# Patient Record
Sex: Female | Born: 1985 | Race: White | Hispanic: No | Marital: Married | Smoking: Never smoker
Health system: Southern US, Community
[De-identification: ages and names within clinical notes are randomized; demographics above are authoritative.]

## PROBLEM LIST (undated history)

## (undated) DIAGNOSIS — E669 Obesity, unspecified: Secondary | ICD-10-CM

## (undated) DIAGNOSIS — I499 Cardiac arrhythmia, unspecified: Secondary | ICD-10-CM

## (undated) DIAGNOSIS — E039 Hypothyroidism, unspecified: Secondary | ICD-10-CM

## (undated) DIAGNOSIS — T8859XA Other complications of anesthesia, initial encounter: Secondary | ICD-10-CM

## (undated) DIAGNOSIS — I10 Essential (primary) hypertension: Secondary | ICD-10-CM

## (undated) HISTORY — DX: Essential (primary) hypertension: I10

## (undated) HISTORY — PX: TONSILLECTOMY: SHX5217

## (undated) HISTORY — DX: Hypothyroidism, unspecified: E03.9

## (undated) HISTORY — DX: Obesity, unspecified: E66.9

---

## 2005-06-10 ENCOUNTER — Emergency Department (HOSPITAL_COMMUNITY): Admission: EM | Admit: 2005-06-10 | Discharge: 2005-06-10 | Payer: Self-pay | Admitting: Emergency Medicine

## 2006-02-09 ENCOUNTER — Emergency Department (HOSPITAL_COMMUNITY): Admission: EM | Admit: 2006-02-09 | Discharge: 2006-02-09 | Payer: Self-pay | Admitting: Emergency Medicine

## 2006-09-18 ENCOUNTER — Emergency Department (HOSPITAL_COMMUNITY): Admission: EM | Admit: 2006-09-18 | Discharge: 2006-09-18 | Payer: Self-pay | Admitting: Emergency Medicine

## 2006-09-20 ENCOUNTER — Emergency Department (HOSPITAL_COMMUNITY): Admission: EM | Admit: 2006-09-20 | Discharge: 2006-09-21 | Payer: Self-pay | Admitting: *Deleted

## 2007-08-13 ENCOUNTER — Ambulatory Visit: Payer: Self-pay | Admitting: Internal Medicine

## 2008-10-11 ENCOUNTER — Ambulatory Visit: Payer: Self-pay

## 2009-02-14 ENCOUNTER — Ambulatory Visit: Payer: Self-pay

## 2009-05-08 ENCOUNTER — Ambulatory Visit: Payer: Self-pay | Admitting: Otolaryngology

## 2009-05-10 ENCOUNTER — Ambulatory Visit: Payer: Self-pay | Admitting: Otolaryngology

## 2010-11-29 ENCOUNTER — Observation Stay: Payer: Self-pay | Admitting: Obstetrics and Gynecology

## 2010-12-05 ENCOUNTER — Observation Stay: Payer: Self-pay | Admitting: Obstetrics and Gynecology

## 2010-12-15 ENCOUNTER — Observation Stay: Payer: Self-pay

## 2010-12-24 ENCOUNTER — Observation Stay: Payer: Self-pay

## 2010-12-27 ENCOUNTER — Inpatient Hospital Stay: Payer: Self-pay | Admitting: Obstetrics and Gynecology

## 2010-12-28 DIAGNOSIS — Z950 Presence of cardiac pacemaker: Secondary | ICD-10-CM

## 2010-12-29 DIAGNOSIS — R011 Cardiac murmur, unspecified: Secondary | ICD-10-CM

## 2011-01-08 ENCOUNTER — Encounter: Payer: Self-pay | Admitting: Cardiovascular Disease

## 2011-08-14 ENCOUNTER — Ambulatory Visit: Payer: Self-pay | Admitting: Family Medicine

## 2012-10-30 ENCOUNTER — Emergency Department: Payer: Self-pay | Admitting: Emergency Medicine

## 2012-10-30 LAB — URINALYSIS, COMPLETE
Bilirubin,UR: NEGATIVE
Glucose,UR: NEGATIVE mg/dL (ref 0–75)
Ketone: NEGATIVE
Protein: 30
RBC,UR: 2 /HPF (ref 0–5)
Specific Gravity: 1.026 (ref 1.003–1.030)
Squamous Epithelial: 6
WBC UR: 2 /HPF (ref 0–5)

## 2012-10-30 LAB — CBC
HCT: 43.2 % (ref 35.0–47.0)
HGB: 14.7 g/dL (ref 12.0–16.0)
MCH: 27.9 pg (ref 26.0–34.0)
MCV: 82 fL (ref 80–100)
Platelet: 376 10*3/uL (ref 150–440)
RBC: 5.25 10*6/uL — ABNORMAL HIGH (ref 3.80–5.20)
WBC: 10.8 10*3/uL (ref 3.6–11.0)

## 2012-10-30 LAB — COMPREHENSIVE METABOLIC PANEL
Albumin: 3.9 g/dL (ref 3.4–5.0)
Alkaline Phosphatase: 99 U/L (ref 50–136)
Anion Gap: 9 (ref 7–16)
BUN: 16 mg/dL (ref 7–18)
Bilirubin,Total: 0.7 mg/dL (ref 0.2–1.0)
Chloride: 106 mmol/L (ref 98–107)
Co2: 25 mmol/L (ref 21–32)
Creatinine: 0.64 mg/dL (ref 0.60–1.30)
EGFR (African American): >60
EGFR (Non-African Amer.): >60
Osmolality: 283 (ref 275–301)
SGOT(AST): 21 U/L (ref 15–37)
SGPT (ALT): 38 U/L (ref 12–78)
Sodium: 140 mmol/L (ref 136–145)
Total Protein: 8 g/dL (ref 6.4–8.2)

## 2012-10-30 LAB — LIPASE, BLOOD: Lipase: 124 U/L (ref 73–393)

## 2012-11-06 ENCOUNTER — Ambulatory Visit: Payer: Self-pay | Admitting: Surgery

## 2012-11-09 ENCOUNTER — Ambulatory Visit: Payer: Self-pay | Admitting: Surgery

## 2013-12-08 LAB — HM PAP SMEAR

## 2013-12-25 IMAGING — US ABDOMEN ULTRASOUND LIMITED
1 series · 14 of 25 positions shown · non-contrast
Comparison: none

REASON FOR EXAM: RUQ pain with hx gallstones
COMMENTS:   Body Site: GB and Fossa, CBD, Head of Pancreas

PROCEDURE:     US  - US ABDOMEN LIMITED SURVEY  - October 30, 2012  [DATE]
RESULT:     Comparison: 08/14/2011
TECHNIQUE: Multiple grayscale and color Doppler images were obtained of the
right upper quadrant.

[Series 1: abdomen ultrasound limited · 0.35mm/px · 14 of 27 slices shown]
[im 1/27]
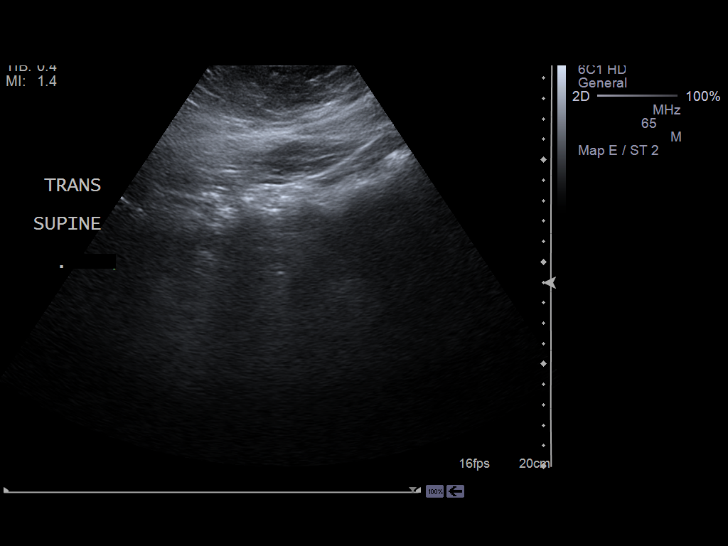
[im 3/27]
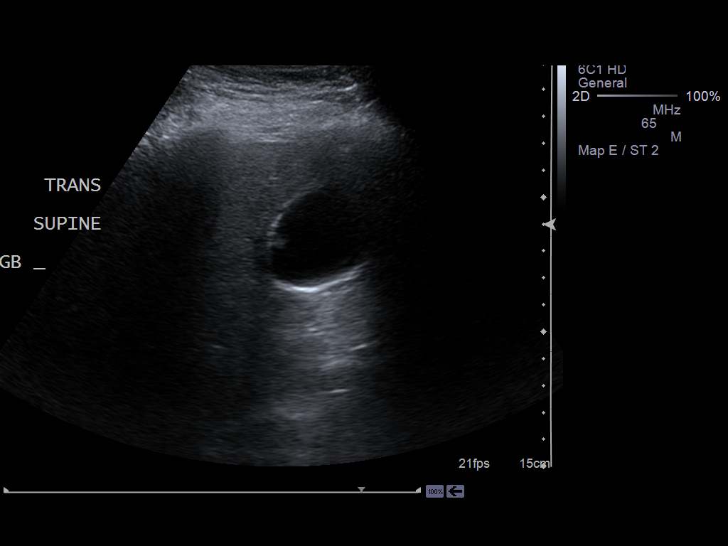
[im 5/27]
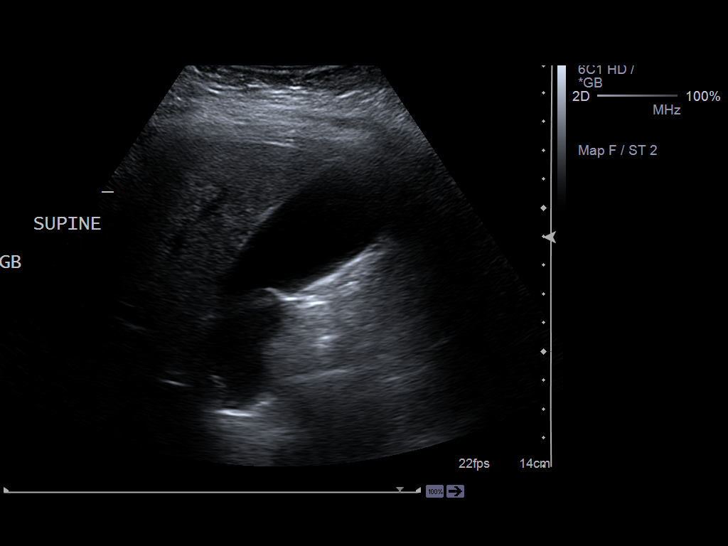
[im 7/27]
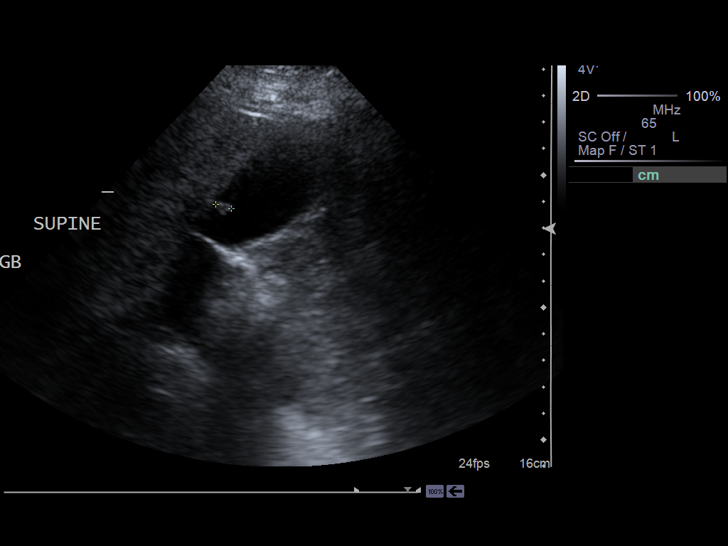
[im 9/27]
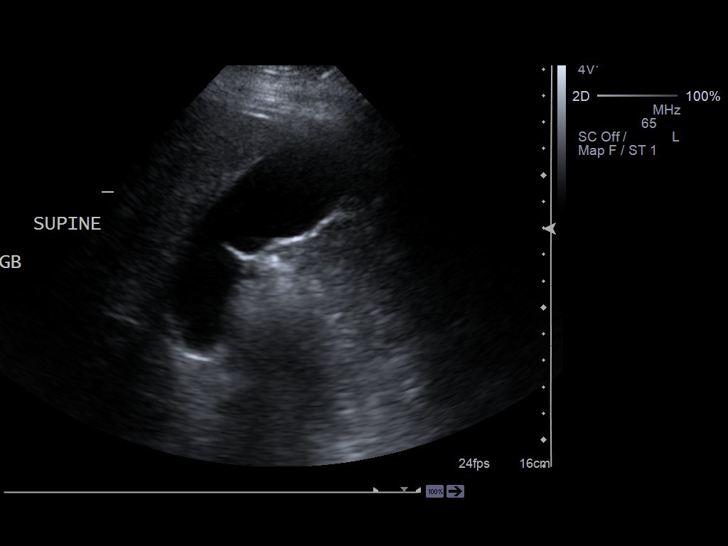
[im 10/27]
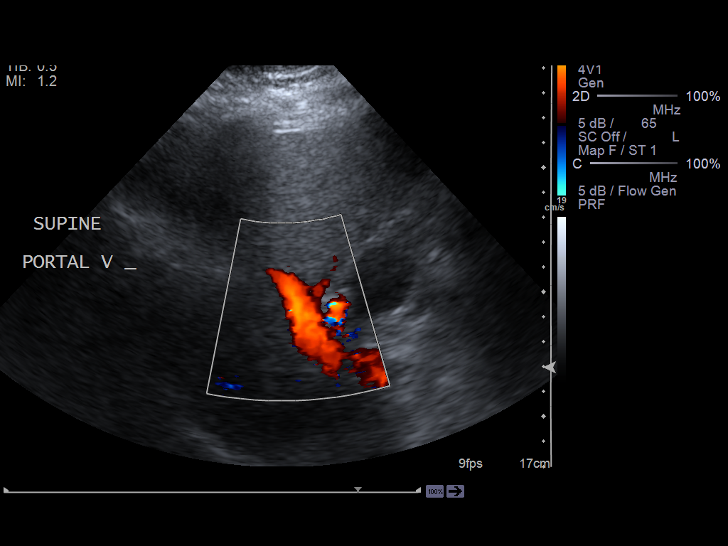
[im 12/27]
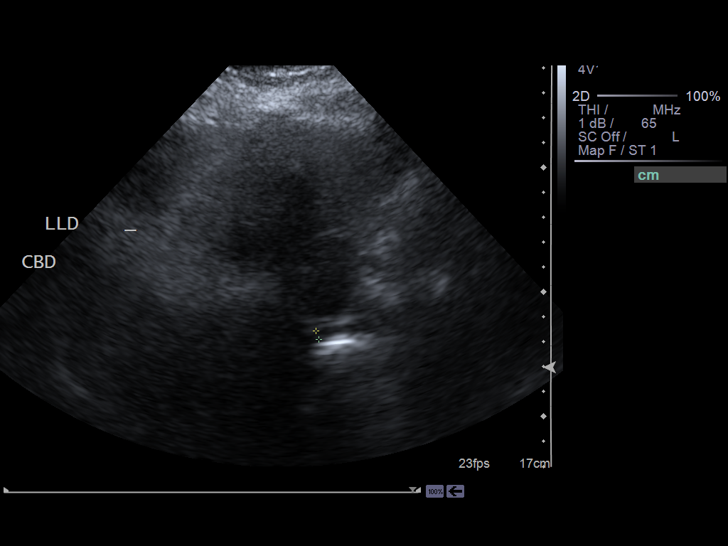
[im 15/27]
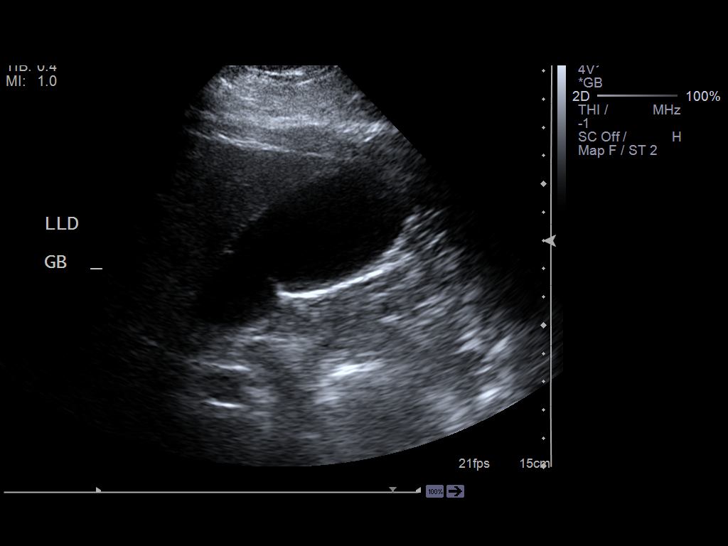
[im 17/27]
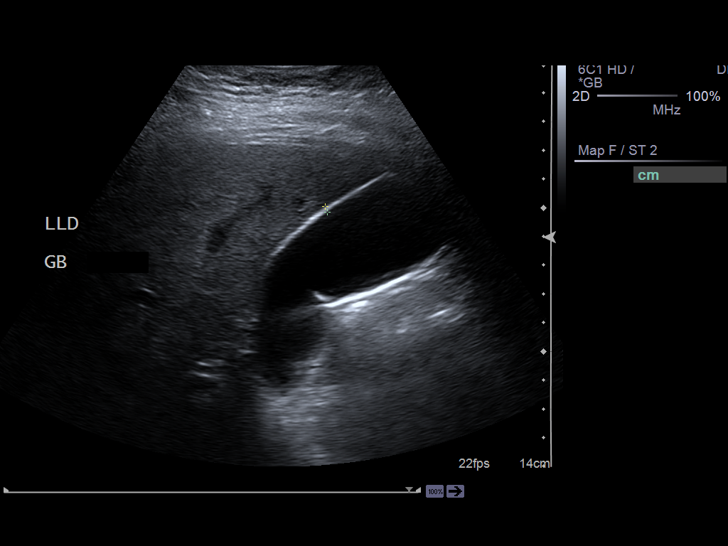
[im 18/27]
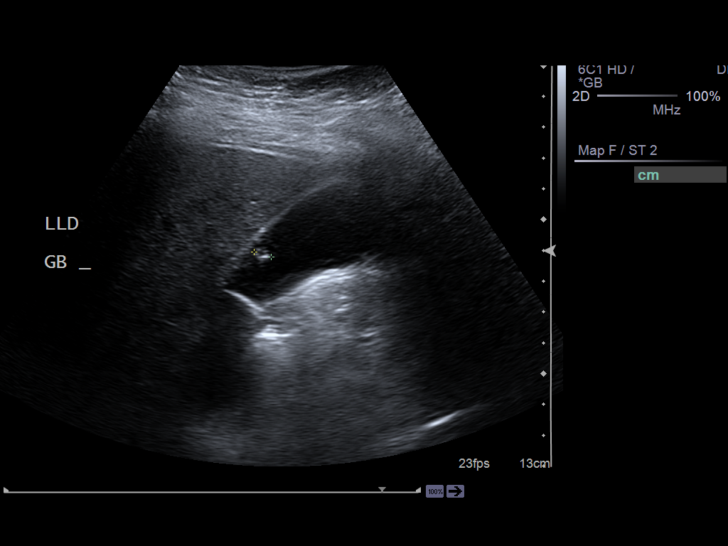
[im 20/27]
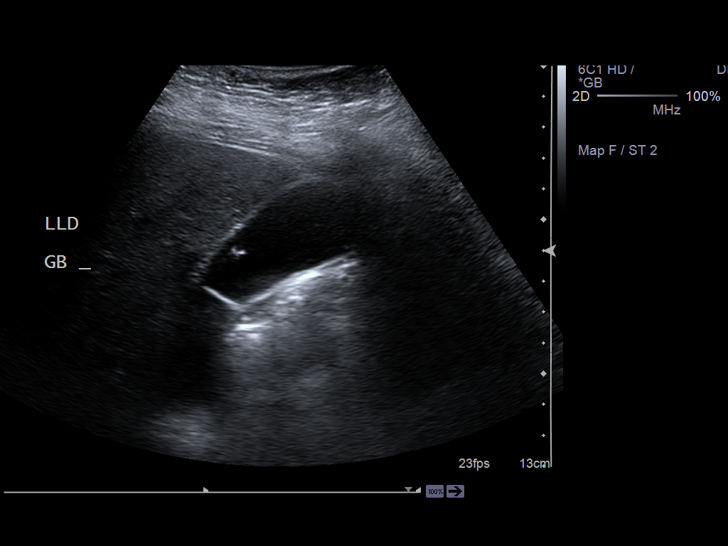
[im 22/27]
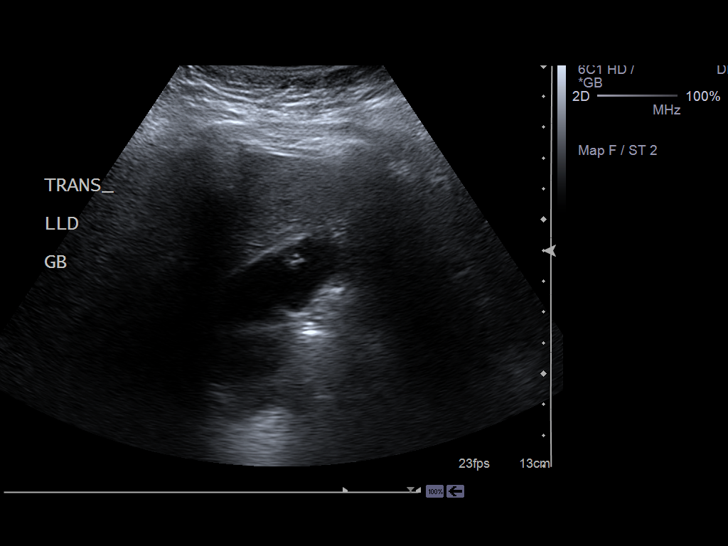
[im 24/27]
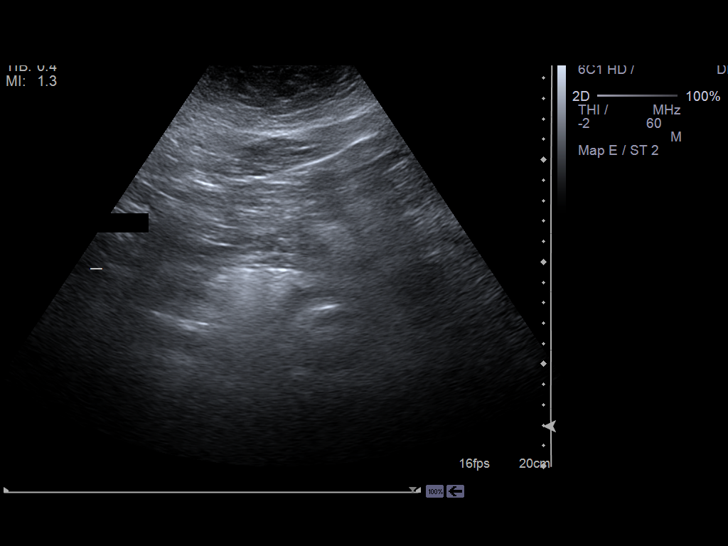
[im 27/27]
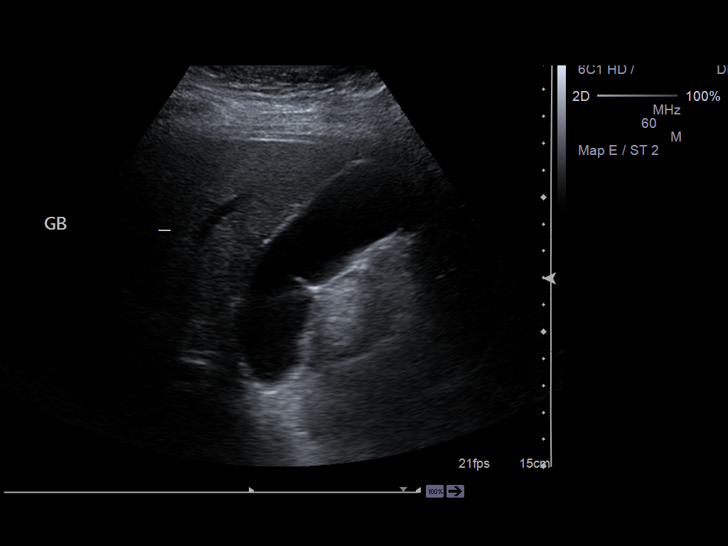

[14 of 25 positions shown; findings below may reference images not displayed]

FINDINGS: The pancreas was obscured overlying bowel gas. There is a 6 mm round
hypoechoic focus along the nondependent wall of the gallbladder. By report
from the technologist, this was nonmobile. This likely represents a polyp,
and is similar to prior. Otherwise, no gallstones seen. No pericholecystic
fluid or gallbladder wall thickening. The common bile duct measures 4 mm in
diameter.

The main portal vein is patent. The visualized portion of the liver is
unremarkable.
IMPRESSION: 1. Findings which likely represent a 6 mm polyp in the gallbladder, similar
to prior. Given its size, continued followup is suggested to ensure
stability.
2. Otherwise, no cholelithiasis.

## 2014-01-04 IMAGING — NM NUCLEAR MEDICINE HEPATOHBILIARY INCLUDE GB
2 series · 12 of 12 positions shown · non-contrast
Comparison: none

REASON FOR EXAM: neg US  US showed polyps
COMMENTS:
TECHNIQUE: Following the uneventful intravenous infusion of
radiopharmaceutical, dynamic anterior regional imaging was obtained over the
liver for 60 minutes.

[Series 1000: gallbladder dynamic (results) · 4.80mm/px · 6 of 60 frames shown]
[frame 6/60]
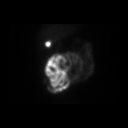
[frame 16/60]
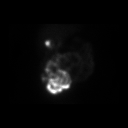
[frame 26/60]
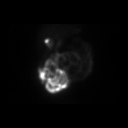
[frame 36/60]
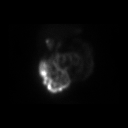
[frame 46/60]
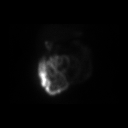
[frame 56/60]
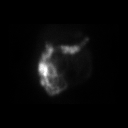

[Series 1000: gallbladder dynamic · 4.80mm/px · 6 of 60 frames shown]
[frame 6/60]
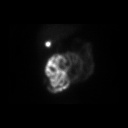
[frame 16/60]
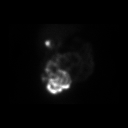
[frame 26/60]
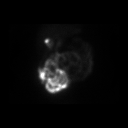
[frame 36/60]
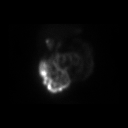
[frame 46/60]
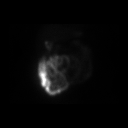
[frame 56/60]
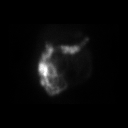

[12 of 12 positions shown; findings below may reference images not displayed]

PROCEDURE:     KNM - KNM HEPATO W/GB EJECT FRACTION  - November 09, 2012  [DATE]

RESULT:     Comparison: None.

Radiopharmaceutical: 8.46 mCi 6c-MMm labeled Choletec was administered
intravenously. Once the gallbladder had accumulated tracer, the patient was
given CCK intravenously per standard protocol.
FINDINGS: There is immediate homogeneous uptake of radiotracer in the liver.
Filling of the gallbladder was seen after 40 minutes. Radiotracer uptake is
present in the small bowel at 10 minutes.

When gallbladder filling was complete, the patient was given an infusion of
2.36 mcg CCK over 30 minutes. At 30 minutes, the total ejection fraction was
85%, which is within normal limits. The normal range of ejection fraction is
greater than 35%.
IMPRESSION: 1. Patent cystic duct and common bile duct.
2. Normal gallbladder ejection fraction.

[REDACTED]

## 2014-08-13 ENCOUNTER — Emergency Department: Payer: Self-pay | Admitting: Student

## 2014-10-20 ENCOUNTER — Ambulatory Visit: Payer: Self-pay | Admitting: Internal Medicine

## 2015-03-16 ENCOUNTER — Other Ambulatory Visit: Payer: Self-pay | Admitting: Internal Medicine

## 2015-03-16 DIAGNOSIS — M5442 Lumbago with sciatica, left side: Secondary | ICD-10-CM

## 2015-03-24 ENCOUNTER — Ambulatory Visit
Admission: RE | Admit: 2015-03-24 | Discharge: 2015-03-24 | Disposition: A | Discharge status: Home / Self Care | Payer: BC Managed Care – PPO | Source: Ambulatory Visit | Attending: Internal Medicine | Admitting: Internal Medicine

## 2015-03-24 DIAGNOSIS — M5442 Lumbago with sciatica, left side: Secondary | ICD-10-CM

## 2015-03-24 DIAGNOSIS — M5127 Other intervertebral disc displacement, lumbosacral region: Secondary | ICD-10-CM | POA: Diagnosis not present

## 2015-10-08 IMAGING — CR DG FOOT COMPLETE 3+V*L*
1 series · 3 of 3 positions shown · non-contrast
Comparison: None.

CLINICAL DATA: Left foot pain after injury.

EXAM:
LEFT FOOT - COMPLETE 3+ VIEW

[Series 1: dxr foot lt comp w/obliques · 0.14mm/px · 3 of 3 slices shown]
[im 1/3]
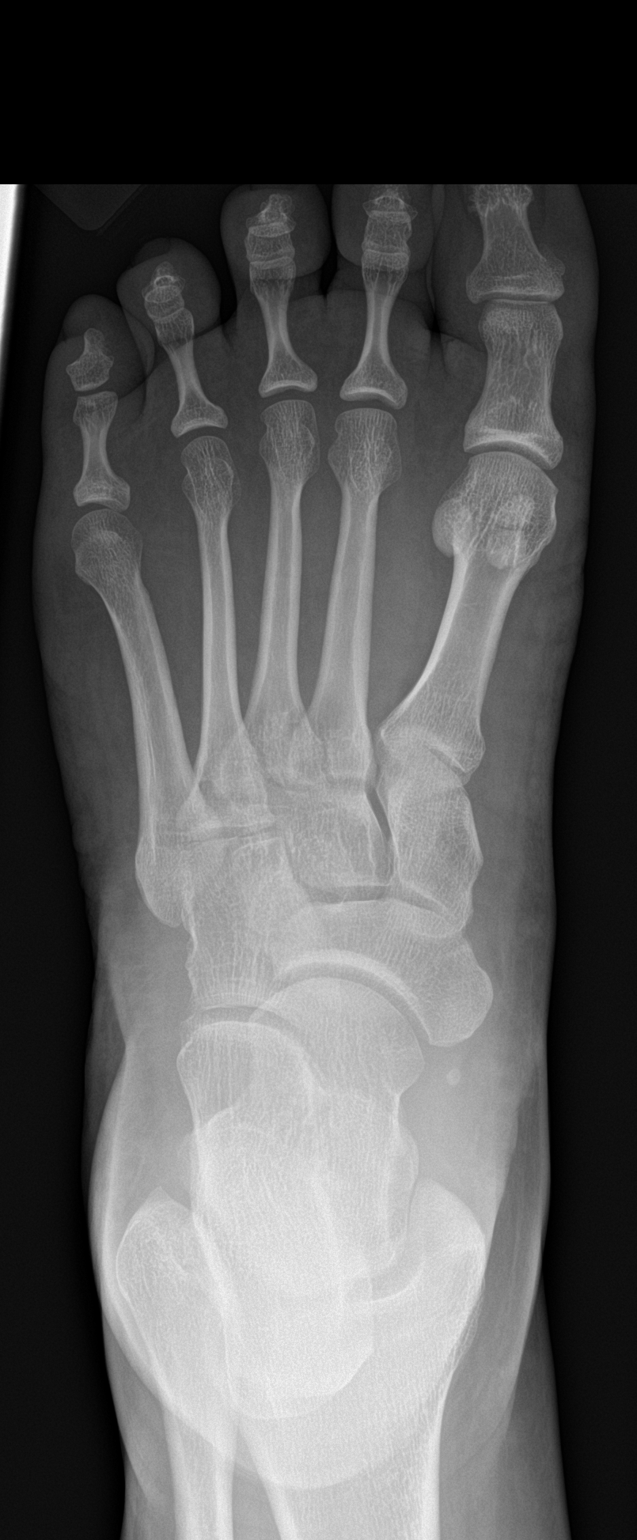
[im 2/3]
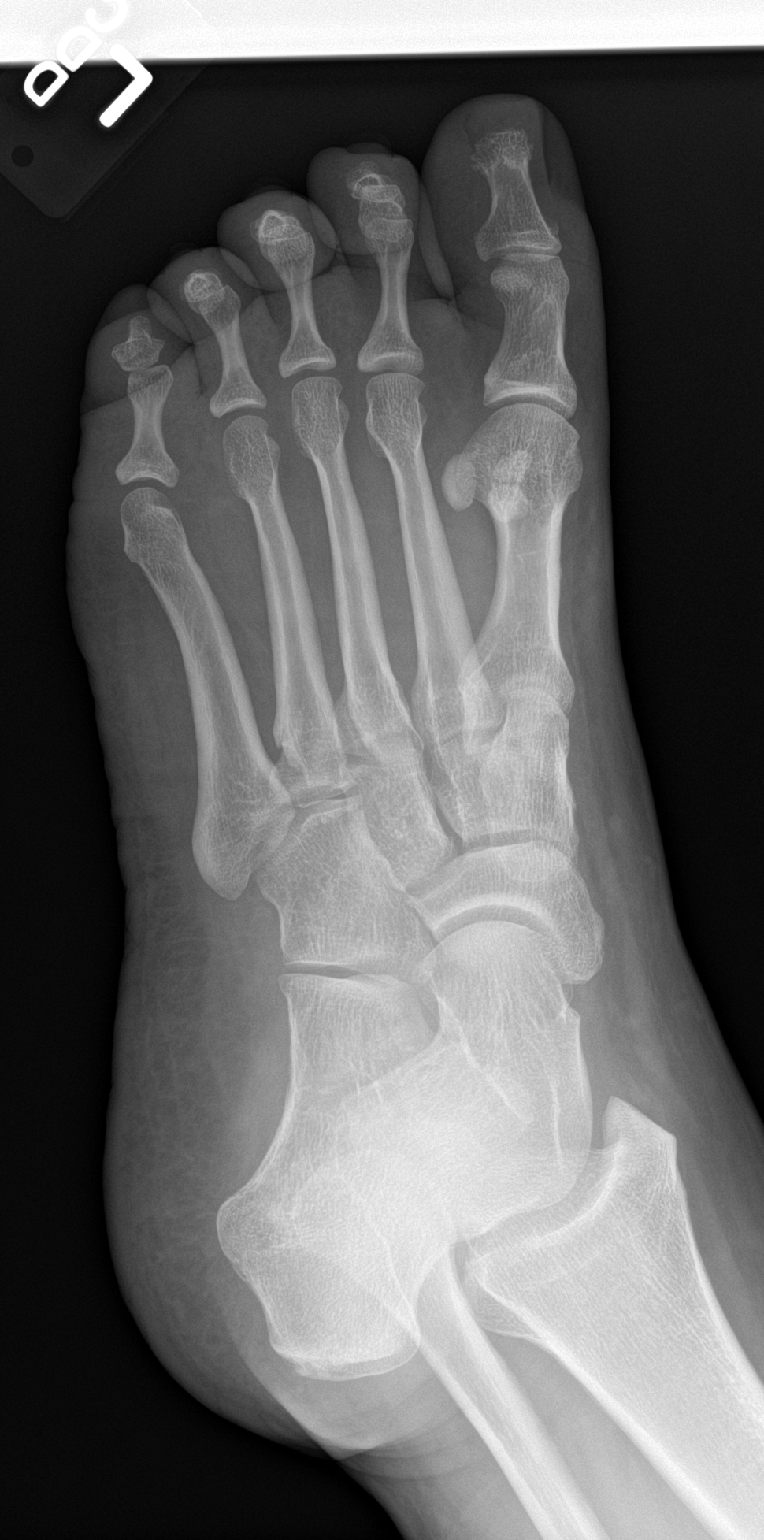
[im 3/3]
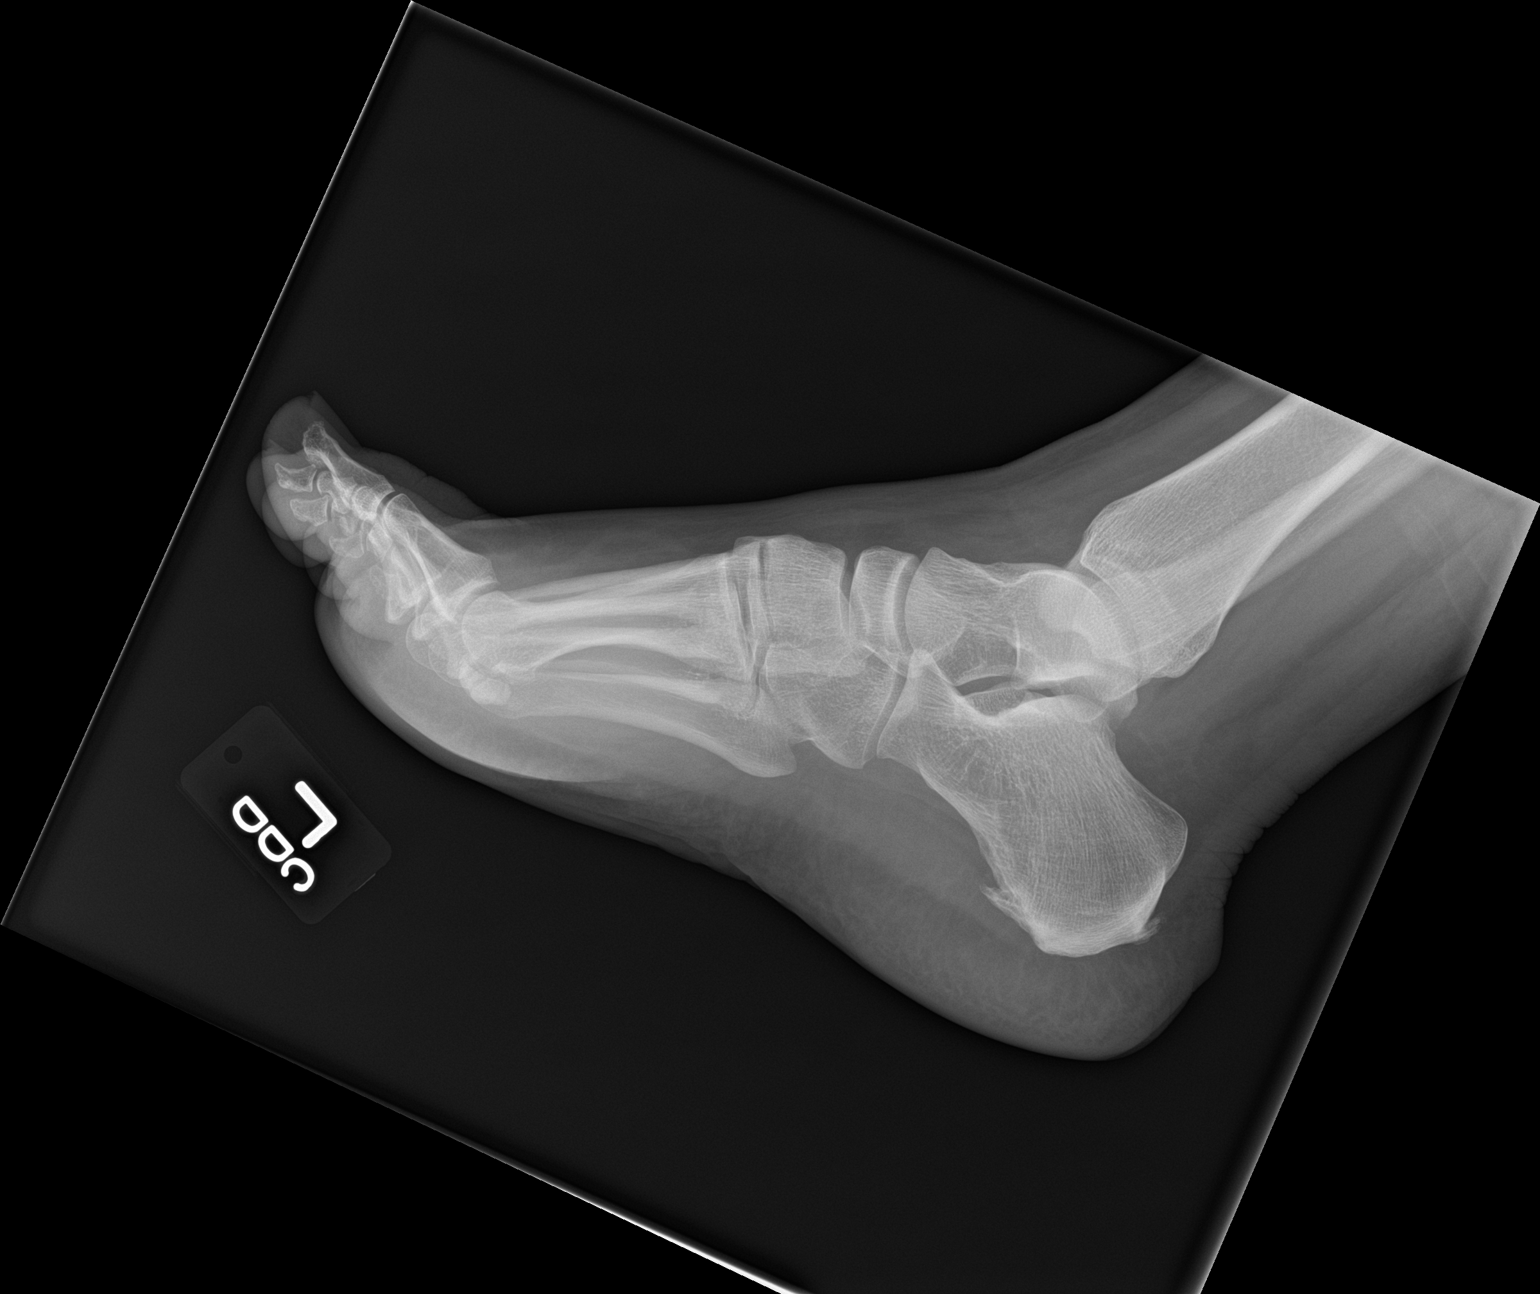

[3 of 3 positions shown; findings below may reference images not displayed]

FINDINGS: There is no evidence of fracture or dislocation. Spurring of
posterior calcaneus is noted. Joint spaces appear intact. Soft
tissues are unremarkable.
IMPRESSION: No significant abnormality seen in the left foot.

## 2016-05-14 ENCOUNTER — Other Ambulatory Visit: Payer: Self-pay | Admitting: Internal Medicine

## 2016-05-14 ENCOUNTER — Other Ambulatory Visit: Payer: Self-pay | Admitting: Physician Assistant

## 2016-05-14 DIAGNOSIS — N6310 Unspecified lump in the right breast, unspecified quadrant: Secondary | ICD-10-CM

## 2016-05-14 DIAGNOSIS — N6315 Unspecified lump in the right breast, overlapping quadrants: Secondary | ICD-10-CM

## 2016-05-14 DIAGNOSIS — N644 Mastodynia: Secondary | ICD-10-CM

## 2016-05-30 ENCOUNTER — Other Ambulatory Visit: Payer: Self-pay

## 2016-05-30 ENCOUNTER — Ambulatory Visit: Payer: BC Managed Care – PPO

## 2017-11-18 DIAGNOSIS — D279 Benign neoplasm of unspecified ovary: Secondary | ICD-10-CM

## 2017-11-18 HISTORY — DX: Benign neoplasm of unspecified ovary: D27.9

## 2018-08-11 ENCOUNTER — Encounter: Payer: Self-pay | Admitting: Obstetrics and Gynecology

## 2018-08-12 ENCOUNTER — Ambulatory Visit (INDEPENDENT_AMBULATORY_CARE_PROVIDER_SITE_OTHER): Payer: BC Managed Care – PPO | Admitting: Obstetrics and Gynecology

## 2018-08-12 ENCOUNTER — Other Ambulatory Visit (HOSPITAL_COMMUNITY)
Admission: RE | Admit: 2018-08-12 | Discharge: 2018-08-12 | Disposition: A | Discharge status: Home / Self Care | Payer: BC Managed Care – PPO | Source: Ambulatory Visit | Attending: Obstetrics and Gynecology | Admitting: Obstetrics and Gynecology

## 2018-08-12 ENCOUNTER — Encounter: Payer: Self-pay | Admitting: Obstetrics and Gynecology

## 2018-08-12 VITALS — BP 122/80 | HR 81 | Ht 67.0 in | Wt 236.0 lb

## 2018-08-12 DIAGNOSIS — Z124 Encounter for screening for malignant neoplasm of cervix: Secondary | ICD-10-CM

## 2018-08-12 DIAGNOSIS — Z01411 Encounter for gynecological examination (general) (routine) with abnormal findings: Secondary | ICD-10-CM | POA: Diagnosis not present

## 2018-08-12 DIAGNOSIS — N6312 Unspecified lump in the right breast, upper inner quadrant: Secondary | ICD-10-CM

## 2018-08-12 DIAGNOSIS — Z3041 Encounter for surveillance of contraceptive pills: Secondary | ICD-10-CM

## 2018-08-12 DIAGNOSIS — Z1151 Encounter for screening for human papillomavirus (HPV): Secondary | ICD-10-CM | POA: Diagnosis not present

## 2018-08-12 DIAGNOSIS — Z1239 Encounter for other screening for malignant neoplasm of breast: Secondary | ICD-10-CM

## 2018-08-12 DIAGNOSIS — Z1231 Encounter for screening mammogram for malignant neoplasm of breast: Secondary | ICD-10-CM

## 2018-08-12 DIAGNOSIS — Z01419 Encounter for gynecological examination (general) (routine) without abnormal findings: Secondary | ICD-10-CM

## 2018-08-12 DIAGNOSIS — R1032 Left lower quadrant pain: Secondary | ICD-10-CM

## 2018-08-12 DIAGNOSIS — N6310 Unspecified lump in the right breast, unspecified quadrant: Secondary | ICD-10-CM

## 2018-08-12 NOTE — Progress Notes (Addendum)
PCP:  Patient, No Pcp Per   Chief Complaint  Patient presents with  . Gynecologic Exam    left side dull pelvic pain x a couple of months,  felt a lump on right breast about a week ago     HPI:      [name] is a 32 y.o. G1P1001 who LMP was Patient's last menstrual period was 08/02/2018 (exact date)., presents today for her NP annual examination.  Her menses are Q3 months with OCPs, lasting 7 days.  Dysmenorrhea none. She does have random intermenstrual bleeding; sx improving the longer she is on 3 mo OCP.  Sex activity: single partner, contraception - OCP (estrogen/progesterone).  She has HTN and is on HCTZ. Has lost 40# and trying to come of HCTZ. PCP has her on OCPs and HCTZ. Last Pap: 2015 Hx of STDs: none  Has noticed LLQ pain the past few months. Sx intermittent and achy. Sex worsens sx. No hx of ovar cysts. No meds taken for sx. Has occas constipation but hasn't correlated it with pain sx. No vag or urin sx.   Last mammogram: at [facility] in past due to fibrocystic breasts. There is no FH of breast cancer. There is no FH of ovarian cancer. The patient does do self-breast exams. She noticed a RT breast mass last wk, non tender. Drinks caffeine daily.   Tobacco use: The patient denies current or previous tobacco use. Alcohol use: none No drug use.  Exercise: moderately active  She does get adequate calcium but not Vitamin D in her diet. Labs with PCP.  Past Medical History:  Diagnosis Date  . Hypothyroidism (acquired)   . Obesity    sleep apnea    Past Surgical History:  Procedure Laterality Date  . CESAREAN SECTION  2012  . TONSILLECTOMY      Family History  Problem Relation Age of Onset  . Diabetes Mother        DM Type 2  . Multiple myeloma Mother 42  . Hypertension Father   . Diabetes Sister        DM Type 2  . Hypertension Sister     Social History   Socioeconomic History  . Marital status: Single    Spouse name: Not on file  . Number of  children: Not on file  . Years of education: Not on file  . Highest education level: Not on file  Occupational History  . Not on file  Social Needs  . Financial resource strain: Not on file  . Food insecurity:    Worry: Not on file    Inability: Not on file  . Transportation needs:    Medical: Not on file    Non-medical: Not on file  Tobacco Use  . Smoking status: Never Smoker  . Smokeless tobacco: Never Used  Substance and Sexual Activity  . Alcohol use: Yes  . Drug use: Never  . Sexual activity: Yes    Birth control/protection: Pill  Lifestyle  . Physical activity:    Days per week: Not on file    Minutes per session: Not on file  . Stress: Not on file  Relationships  . Social connections:    Talks on phone: Not on file    Gets together: Not on file    Attends religious service: Not on file    Active member of club or organization: Not on file    Attends meetings of clubs or organizations: Not on file  Relationship status: Not on file  . Intimate partner violence:    Fear of current or ex partner: Not on file    Emotionally abused: Not on file    Physically abused: Not on file    Forced sexual activity: Not on file  Other Topics Concern  . Not on file  Social History Narrative  . Not on file    Outpatient Medications Prior to Visit  Medication Sig Dispense Refill  . hydrochlorothiazide (HYDRODIURIL) 25 MG tablet TAKE 1 TABLET BY MOUTH EVERY DAY    . ibuprofen (ADVIL,MOTRIN) 200 MG tablet Take by mouth.    . levonorgestrel-ethinyl estradiol (SEASONALE,INTROVALE,JOLESSA) 0.15-0.03 MG tablet Take by mouth.     No facility-administered medications prior to visit.       ROS:  Review of Systems  Constitutional: Negative for fatigue, fever and unexpected weight change.  Respiratory: Negative for cough, shortness of breath and wheezing.   Cardiovascular: Negative for chest pain, palpitations and leg swelling.  Gastrointestinal: Negative for blood in stool,  constipation, diarrhea, nausea and vomiting.  Endocrine: Negative for cold intolerance, heat intolerance and polyuria.  Genitourinary: Positive for dyspareunia. Negative for dysuria, flank pain, frequency, genital sores, hematuria, menstrual problem, pelvic pain, urgency, vaginal bleeding, vaginal discharge and vaginal pain.  Musculoskeletal: Negative for back pain, joint swelling and myalgias.  Skin: Negative for rash.  Neurological: Negative for dizziness, syncope, light-headedness, numbness and headaches.  Hematological: Negative for adenopathy.  Psychiatric/Behavioral: Negative for agitation, confusion, sleep disturbance and suicidal ideas. The patient is not nervous/anxious.   BREAST: mass   Objective: BP 122/80   Pulse 81   Ht _0  (1.702 m)   Wt 236 lb (107 kg)   LMP 08/02/2018 (Exact Date)   BMI 36.96 kg/m    Physical Exam  Constitutional: She is oriented to person, place, and time. She appears well-developed and well-nourished.  Genitourinary: Vagina normal and uterus normal. There is no rash or tenderness on the right labia. There is no rash or tenderness on the left labia. No erythema or tenderness in the vagina. No vaginal discharge found. Right adnexum does not display mass and does not display tenderness. Left adnexum does not display mass and does not display tenderness. Cervix does not exhibit motion tenderness or polyp. Uterus is not enlarged or tender.  Neck: Normal range of motion. No thyromegaly present.  Cardiovascular: Normal rate, regular rhythm and normal heart sounds.  No murmur heard. Pulmonary/Chest: Effort normal and breath sounds normal. Right breast exhibits no mass, no nipple discharge, no skin change and no tenderness. Left breast exhibits no mass, no nipple discharge, no skin change and no tenderness.    Abdominal: Soft. There is no tenderness. There is no guarding.  Musculoskeletal: Normal range of motion.  Neurological: She is alert and oriented to  person, place, and time. No cranial nerve deficit.  Psychiatric: She has a normal mood and affect. Her behavior is normal.  Vitals reviewed.   Assessment/Plan: Encounter for annual routine gynecological examination  Cervical cancer screening - Plan: Cytology - PAP  Screening for HPV (human papillomavirus) - Plan: Cytology - PAP  Breast mass, right - 3:00 pos RT breast. Question cyst vs fibrocystic breasts. Check dx mammo and u/s. D/C caffeine.  - Plan: MM DIAG BREAST TOMO BILATERAL, US BREAST LTD UNI RIGHT INC AXILLA, US BREAST LTD UNI LEFT INC AXILLA  LLQ pain - Neg exam. Check GYN u/s. Will call with results. If neg, question constipation as etiology.  - Plan:  US PELVIS TRANSVANGINAL NON-OB (TV ONLY)  Encounter for surveillance of contraceptive pills - Pt getting OCPs with PCP. Discussed HTN and estrogen products. Pt is hoping to get off BP meds. Otherwise, suggested prog only options.   Screening for breast cancer - Plan: MM DIAG BREAST TOMO BILATERAL, US BREAST LTD UNI RIGHT INC AXILLA, US BREAST LTD UNI LEFT INC AXILLA           GYN counsel breast self exam, mammography screening, use and side effects of OCP's, family planning choices, adequate intake of calcium and vitamin D, diet and exercise     F/U  Return in about 1 day (around 08/13/2018) for GYN u/s for LLQ pain--[facility] to call pt.  Serai Tukes B. Adriano Bischof, PA-C 08/12/2018 3:57 PM

## 2018-08-12 NOTE — Patient Instructions (Signed)
I value your feedback and entrusting us with your care. If you get a [facility] patient survey, I would appreciate you taking the time to let us know about your experience today. Thank you! 

## 2018-08-12 NOTE — Addendum Note (Signed)
Addended by: Ardeth Perfect B on: 9/44/4619 03:57 PM   Modules accepted: Orders

## 2018-08-13 ENCOUNTER — Ambulatory Visit (INDEPENDENT_AMBULATORY_CARE_PROVIDER_SITE_OTHER): Payer: BC Managed Care – PPO

## 2018-08-13 ENCOUNTER — Ambulatory Visit: Payer: Self-pay

## 2018-08-13 DIAGNOSIS — R1032 Left lower quadrant pain: Secondary | ICD-10-CM | POA: Diagnosis not present

## 2018-08-13 DIAGNOSIS — N83291 Other ovarian cyst, right side: Secondary | ICD-10-CM

## 2018-08-14 ENCOUNTER — Telehealth: Payer: Self-pay | Admitting: Obstetrics and Gynecology

## 2018-08-14 DIAGNOSIS — N83201 Unspecified ovarian cyst, right side: Secondary | ICD-10-CM

## 2018-08-14 NOTE — Telephone Encounter (Signed)
LM with results. RTO ovar poss cyst vs dermoid, neg LT adnexa. Pain is LLQ, so still could be due to constipation.  Rechk u/s in 8 wks--will call with results again.

## 2018-08-18 LAB — CYTOLOGY - PAP
ADEQUACY: ABSENT
DIAGNOSIS: NEGATIVE
HPV: NOT DETECTED

## 2018-09-01 ENCOUNTER — Ambulatory Visit
Admission: RE | Admit: 2018-09-01 | Discharge: 2018-09-01 | Disposition: A | Discharge status: Home / Self Care | Payer: BC Managed Care – PPO | Source: Ambulatory Visit | Attending: Obstetrics and Gynecology | Admitting: Obstetrics and Gynecology

## 2018-09-01 DIAGNOSIS — N6310 Unspecified lump in the right breast, unspecified quadrant: Secondary | ICD-10-CM

## 2018-09-01 DIAGNOSIS — Z1239 Encounter for other screening for malignant neoplasm of breast: Secondary | ICD-10-CM | POA: Diagnosis present

## 2018-10-13 ENCOUNTER — Ambulatory Visit (INDEPENDENT_AMBULATORY_CARE_PROVIDER_SITE_OTHER): Payer: BC Managed Care – PPO

## 2018-10-13 DIAGNOSIS — N83201 Unspecified ovarian cyst, right side: Secondary | ICD-10-CM

## 2018-10-14 ENCOUNTER — Telehealth: Payer: Self-pay | Admitting: Obstetrics and Gynecology

## 2018-10-14 NOTE — Telephone Encounter (Signed)
Pt aware of stable GYN u/s. Pt not trying to conceive currently. On OCPs. No RLQ pain. LLQ pain is minimal and sporadic. Rechk stability in 6 months. F/u sooner prn. Discussed with Dr. Kenton Kingfisher.   Patient Name: Dana Shepherd DOB: [birth year] MRN: [id number] ULTRASOUND REPORT  Location: [facility]  Date of Service: 10/13/2018     Indications: Follow up right ovarian mass. Findings:  The uterus is anteverted and measures 7.3 x 3.7 x 3.4 cm. Echo texture is homogenous without evidence of focal masses.  The Endometrium measures 3.5 mm.  Right Ovary measures 4.5 x 4.3 x 4.0 cm. Complex mass appears unchanged. Left Ovary measures 2.6 x 1.0 x 1.6 cm. It is normal in appearance. Survey of the adnexa demonstrates no adnexal masses. There is no free fluid in the cul de sac.  Impression: Complex right ovarian mass again seen. Appears unchanged from prior. Ultrasound appearance suggestive of dermoid.  Vita Barley, RDMS RVT  The ultrasound images and findings were reviewed by me and I agree with the above report.  Prentice Docker, MD, [facility], [facility] 10/13/2018 4:44 PM

## 2019-09-29 ENCOUNTER — Other Ambulatory Visit: Payer: Self-pay

## 2019-09-29 DIAGNOSIS — Z20822 Contact with and (suspected) exposure to covid-19: Secondary | ICD-10-CM

## 2019-10-01 LAB — NOVEL CORONAVIRUS, NAA: SARS-CoV-2, NAA: NOT DETECTED

## 2019-10-23 ENCOUNTER — Other Ambulatory Visit: Payer: Self-pay | Admitting: Obstetrics and Gynecology

## 2019-10-23 DIAGNOSIS — N83201 Unspecified ovarian cyst, right side: Secondary | ICD-10-CM

## 2019-11-23 ENCOUNTER — Other Ambulatory Visit: Payer: Self-pay

## 2019-11-23 ENCOUNTER — Encounter: Payer: Self-pay | Admitting: Obstetrics and Gynecology

## 2019-11-23 ENCOUNTER — Ambulatory Visit (INDEPENDENT_AMBULATORY_CARE_PROVIDER_SITE_OTHER): Payer: BC Managed Care – PPO

## 2019-11-23 ENCOUNTER — Ambulatory Visit (INDEPENDENT_AMBULATORY_CARE_PROVIDER_SITE_OTHER): Payer: BC Managed Care – PPO | Admitting: Obstetrics and Gynecology

## 2019-11-23 ENCOUNTER — Other Ambulatory Visit: Payer: Self-pay | Admitting: Obstetrics and Gynecology

## 2019-11-23 VITALS — BP 148/110 | Ht 67.0 in | Wt 263.0 lb

## 2019-11-23 DIAGNOSIS — I10 Essential (primary) hypertension: Secondary | ICD-10-CM | POA: Insufficient documentation

## 2019-11-23 DIAGNOSIS — R1032 Left lower quadrant pain: Secondary | ICD-10-CM

## 2019-11-23 DIAGNOSIS — D270 Benign neoplasm of right ovary: Secondary | ICD-10-CM

## 2019-11-23 DIAGNOSIS — N83201 Unspecified ovarian cyst, right side: Secondary | ICD-10-CM | POA: Diagnosis not present

## 2019-11-23 DIAGNOSIS — Z01419 Encounter for gynecological examination (general) (routine) without abnormal findings: Secondary | ICD-10-CM | POA: Diagnosis not present

## 2019-11-23 DIAGNOSIS — Z3041 Encounter for surveillance of contraceptive pills: Secondary | ICD-10-CM

## 2019-11-23 MED ORDER — SLYND 4 MG PO TABS: 1.0000 | ORAL_TABLET | Freq: Every day | ORAL | 2 refills | Status: DC

## 2019-11-23 NOTE — Patient Instructions (Signed)
I value your feedback and entrusting us with your care. If you get a [facility] patient survey, I would appreciate you taking the time to let us know about your experience today. Thank you!  As of October 28, 2019, your lab results will be released to your MyChart immediately, before I even have a chance to see them. Please give me time to review them and contact you if there are any abnormalities. Thank you for your patience.  

## 2019-11-23 NOTE — Progress Notes (Signed)
PCP:  Marinda Elk, MD   Chief Complaint  Patient presents with  . Gynecologic Exam    had u/s     HPI:      [name] is a 34 y.o. G1P1001 who LMP was Patient's last menstrual period was 10/26/2019 (approximate)., presents today for her annual examination.  Her menses are Q3 months with OCPs, lasting 4 days.  Dysmenorrhea none. She does have random intermenstrual bleeding/dark d/c for about a day.   Sex activity: single partner, contraception - OCP (estrogen/progesterone).  She has HTN and is on HCTZ. Lost 40# last yr and was trying to come of HCTZ, but has gained wt back with covid and HCTZ not controlling BP recently per pt report. Having headaches and dizziness recently. Saw PCP and has f/u soon. PCP has been prescribing OCPs and HCTZ.  Pt had Mirena IUD PP and removed due to bleeding/clotting due to malposition.   Last Pap: 08/12/18 Results: no abnormalities/neg HPV DNA Hx of STDs: none  Has had LLQ pain since last yr. Sx intermittent and achy and less frequent from last yr. No relation to bowel habits, denies diarrhea/constipation. Noted sometimes after sex.  No meds taken for sx. GYN u/s last yr showed RT ovar dermoid cyst. Had f/u 11/19 and today and cyst is stable in size. No sx.   Last mammogram: 09/01/18 at [facility]; results were normal, repeat age 3. Last yr had a RT breast mass that was normal fibroglandular tissue and fat lobules on mammo and u/s.  Had mammos at [facility] in past due to fibrocystic breasts. There is no FH of breast cancer. There is no FH of ovarian cancer. The patient does do self-breast exams.   Tobacco use: The patient denies current or previous tobacco use. Alcohol use: social No drug use.  Exercise: min active  She does get adequate calcium but not Vitamin D in her diet. Recently diagnosed as Vit D deficient. Taking supp. Labs with PCP.  Past Medical History:  Diagnosis Date  . Dermoid cyst of ovary 2019  . Hypertension   .  Hypothyroidism (acquired)   . Obesity    sleep apnea    Past Surgical History:  Procedure Laterality Date  . CESAREAN SECTION  2012  . TONSILLECTOMY      Family History  Problem Relation Age of Onset  . Diabetes Mother        DM Type 2  . Multiple myeloma Mother 41  . Hypertension Father   . Diabetes Sister        DM Type 2  . Hypertension Sister     Social History   Socioeconomic History  . Marital status: Single    Spouse name: Not on file  . Number of children: Not on file  . Years of education: Not on file  . Highest education level: Not on file  Occupational History  . Not on file  Tobacco Use  . Smoking status: Never Smoker  . Smokeless tobacco: Never Used  Substance and Sexual Activity  . Alcohol use: Yes  . Drug use: Never  . Sexual activity: Yes    Birth control/protection: Pill  Other Topics Concern  . Not on file  Social History Narrative  . Not on file   Social Determinants of Health   Financial Resource Strain:   . Difficulty of Paying Living Expenses: Not on file  Food Insecurity:   . Worried About Charity fundraiser in the Last Year: Not  on file  . Ran Out of Food in the Last Year: Not on file  Transportation Needs:   . Lack of Transportation (Medical): Not on file  . Lack of Transportation (Non-Medical): Not on file  Physical Activity:   . Days of Exercise per Week: Not on file  . Minutes of Exercise per Session: Not on file  Stress:   . Feeling of Stress : Not on file  Social Connections:   . Frequency of Communication with Friends and Family: Not on file  . Frequency of Social Gatherings with Friends and Family: Not on file  . Attends Religious Services: Not on file  . Active Member of Clubs or Organizations: Not on file  . Attends Club or Organization Meetings: Not on file  . Marital Status: Not on file  Intimate Partner Violence:   . Fear of Current or Ex-Partner: Not on file  . Emotionally Abused: Not on file  . Physically  Abused: Not on file  . Sexually Abused: Not on file    Outpatient Medications Prior to Visit  Medication Sig Dispense Refill  . Cholecalciferol 25 MCG (1000 UT) tablet Take by mouth.    . cyanocobalamin 1000 MCG tablet Take by mouth.    . hydrochlorothiazide (HYDRODIURIL) 25 MG tablet TAKE 1 TABLET BY MOUTH EVERY DAY    . ibuprofen (ADVIL,MOTRIN) 200 MG tablet Take by mouth.    . meclizine (ANTIVERT) 12.5 MG tablet Take by mouth.    . meloxicam (MOBIC) 15 MG tablet Take by mouth.    . levonorgestrel-ethinyl estradiol (SEASONALE,INTROVALE,JOLESSA) 0.15-0.03 MG tablet Take by mouth.     No facility-administered medications prior to visit.      ROS:  Review of Systems  Constitutional: Negative for fatigue, fever and unexpected weight change.  Respiratory: Negative for cough, shortness of breath and wheezing.   Cardiovascular: Negative for chest pain, palpitations and leg swelling.  Gastrointestinal: Negative for blood in stool, constipation, diarrhea, nausea and vomiting.  Endocrine: Negative for cold intolerance, heat intolerance and polyuria.  Genitourinary: Positive for pelvic pain. Negative for dyspareunia, dysuria, flank pain, frequency, genital sores, hematuria, menstrual problem, urgency, vaginal bleeding, vaginal discharge and vaginal pain.  Musculoskeletal: Negative for back pain, joint swelling and myalgias.  Skin: Negative for rash.  Neurological: Positive for dizziness and headaches. Negative for syncope, light-headedness and numbness.  Hematological: Negative for adenopathy.  Psychiatric/Behavioral: Negative for agitation, confusion, sleep disturbance and suicidal ideas. The patient is not nervous/anxious.   BREAST: mass   Objective: BP (!) 148/110   Ht 5' 7" (1.702 m)   Wt 263 lb (119.3 kg)   LMP 10/26/2019 (Approximate)   BMI 41.19 kg/m    Physical Exam Constitutional:      Appearance: She is well-developed.  Genitourinary:     Vulva, vagina, cervix,  uterus, right adnexa and left adnexa normal.     No vulval lesion or tenderness noted.     No vaginal discharge, erythema or tenderness.     No cervical polyp.     Uterus is not enlarged or tender.     No right or left adnexal mass present.     Right adnexa not tender.     Left adnexa not tender.  Neck:     Thyroid: No thyromegaly.  Cardiovascular:     Rate and Rhythm: Normal rate and regular rhythm.     Heart sounds: Normal heart sounds. No murmur.  Pulmonary:     Effort: Pulmonary effort is normal.       Breath sounds: Normal breath sounds.  Chest:     Breasts:        Right: No mass, nipple discharge, skin change or tenderness.        Left: No mass, nipple discharge, skin change or tenderness.  Abdominal:     Palpations: Abdomen is soft.     Tenderness: There is no abdominal tenderness. There is no guarding.  Musculoskeletal:        General: Normal range of motion.     Cervical back: Normal range of motion.  Neurological:     General: No focal deficit present.     Mental Status: She is alert and oriented to person, place, and time.     Cranial Nerves: No cranial nerve deficit.  Skin:    General: Skin is warm and dry.  Psychiatric:        Mood and Affect: Mood normal.        Behavior: Behavior normal.        Thought Content: Thought content normal.        Judgment: Judgment normal.  Vitals reviewed.    ULTRASOUND REPORT  Location: [facility]  Date of Service: 11/23/2019    Indications: Ovarian cyst Findings:  The uterus is anteverted and measures 7.7 x 3.8 x 3.2 cm. Echo texture is homogenous without evidence of focal masses. The Endometrium measures 4.0 mm.  Right Ovary  Is abnormal with a complex cyst, most likely dermoid, measuring 51.5 x 45.6 x 45.1 mm Left Ovary measures 2.6 x 2.5 x 2.0 cm. It is normal in appearance. Survey of the adnexa demonstrates no adnexal masses. There is no free fluid in the cul de sac.  Impression: 1. Normal uterus and  left ovary. 2. There is complex cyst in the right ovary. No blood flow within. Most likely a dermoid cyst.   Recommendations: 1.Clinical correlation with the patient's History and Physical Exam.   Elyse S Fairbanks, RT  Assessment/Plan: Encounter for annual routine gynecological examination  Encounter for surveillance of contraceptive pills - Plan: Drospirenone (SLYND) 4 MG TABS; Discussed prog only options of BC due to uncontrolled HTN. Will change to POPs. Rx slynd and 2 samples/get coupon card online. Pt had bad experience with IUD, may consider nexplanon.   Essential hypertension--has f/u with PCP. Uncontrolled currently. Diet/exercise/wt loss changes improved it last yr.  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 Discussed surg vs repeat u/s in 1 yr to assess stability. Discussed small chance of developing into malignancy. Pt elects to recheck u/s before annual next yr.  LLQ pain--occas, less frequent this yr. Neg GYN u/s on LT side. F/u prn.           Meds ordered this encounter  Medications  . Drospirenone (SLYND) 4 MG TABS    Sig: Take 1 tablet by mouth daily. 2 SAMPLES GIVEN    Dispense:  84 tablet    Refill:  2    Order Specific Question:   Supervising Provider    Answer:   HARRIS, ROBERT P [[id number]]    GYN counsel breast self exam, mammography screening, use and side effects of OCP's, family planning choices, adequate intake of calcium and vitamin D, diet and exercise     F/U  Return in about 1 year (around 11/22/2020).  Dana B. Copland, PA-C 11/23/2019 3:18 PM 

## 2019-11-30 ENCOUNTER — Other Ambulatory Visit: Payer: Self-pay | Admitting: Obstetrics and Gynecology

## 2020-03-27 ENCOUNTER — Other Ambulatory Visit: Payer: Self-pay

## 2020-03-27 ENCOUNTER — Encounter: Payer: Self-pay | Admitting: Emergency Medicine

## 2020-03-27 ENCOUNTER — Emergency Department
Admission: EM | Admit: 2020-03-27 | Discharge: 2020-03-27 | Disposition: A | Discharge status: Home / Self Care | Payer: BC Managed Care – PPO | Attending: Student in an Organized Health Care Education/Training Program | Admitting: Student in an Organized Health Care Education/Training Program

## 2020-03-27 ENCOUNTER — Emergency Department: Payer: BC Managed Care – PPO

## 2020-03-27 DIAGNOSIS — I10 Essential (primary) hypertension: Secondary | ICD-10-CM | POA: Insufficient documentation

## 2020-03-27 DIAGNOSIS — R519 Headache, unspecified: Secondary | ICD-10-CM | POA: Diagnosis not present

## 2020-03-27 DIAGNOSIS — E039 Hypothyroidism, unspecified: Secondary | ICD-10-CM | POA: Diagnosis not present

## 2020-03-27 DIAGNOSIS — R42 Dizziness and giddiness: Secondary | ICD-10-CM

## 2020-03-27 LAB — TROPONIN I (HIGH SENSITIVITY)
Troponin I (High Sensitivity): <2 ng/L (ref <18)
Troponin I (High Sensitivity): 3 ng/L (ref <18)

## 2020-03-27 LAB — CBC
HCT: 44.7 % (ref 36.0–46.0)
Hemoglobin: 15.9 g/dL — ABNORMAL HIGH (ref 12.0–15.0)
MCH: 29.6 pg (ref 26.0–34.0)
MCHC: 35.6 g/dL (ref 30.0–36.0)
MCV: 83.2 fL (ref 80.0–100.0)
Platelets: 430 10*3/uL — ABNORMAL HIGH (ref 150–400)
RBC: 5.37 MIL/uL — ABNORMAL HIGH (ref 3.87–5.11)
RDW: 12.3 % (ref 11.5–15.5)
WBC: 11 10*3/uL — ABNORMAL HIGH (ref 4.0–10.5)
nRBC: 0 % (ref 0.0–0.2)

## 2020-03-27 LAB — BASIC METABOLIC PANEL
Anion gap: 8 (ref 5–15)
BUN: 13 mg/dL (ref 6–20)
CO2: 24 mmol/L (ref 22–32)
Calcium: 10.3 mg/dL (ref 8.9–10.3)
Chloride: 103 mmol/L (ref 98–111)
Creatinine, Ser: 0.64 mg/dL (ref 0.44–1.00)
GFR calc Af Amer: >60 mL/min (ref >60)
GFR calc non Af Amer: >60 mL/min (ref >60)
Glucose, Bld: 98 mg/dL (ref 70–99)
Potassium: 3.6 mmol/L (ref 3.5–5.1)
Sodium: 135 mmol/L (ref 135–145)

## 2020-03-27 LAB — HCG, QUANTITATIVE, PREGNANCY: hCG, Beta Chain, Quant, S: <1 m[IU]/mL (ref <5)

## 2020-03-27 LAB — FIBRIN DERIVATIVES D-DIMER (ARMC ONLY): Fibrin derivatives D-dimer (ARMC): 207.67 ng/mL (FEU) (ref 0.00–499.00)

## 2020-03-27 LAB — TSH: TSH: 2.431 u[IU]/mL (ref 0.350–4.500)

## 2020-03-27 MED ORDER — SODIUM CHLORIDE 0.9% FLUSH: 3.0000 mL | Freq: Once | INTRAVENOUS | Status: DC

## 2020-03-27 MED ORDER — ACETAMINOPHEN 500 MG PO TABS
1000.0000 mg | ORAL_TABLET | Freq: Once | ORAL | Status: AC
  Administered 2020-03-27: 1000 mg via ORAL
  Filled 2020-03-27: qty 2

## 2020-03-27 MED ORDER — PROCHLORPERAZINE MALEATE 10 MG PO TABS
10.0000 mg | ORAL_TABLET | Freq: Once | ORAL | Status: AC
  Administered 2020-03-27: 10 mg via ORAL
  Filled 2020-03-27: qty 1

## 2020-03-27 MED ORDER — SODIUM CHLORIDE 0.9 % IV BOLUS
1000.0000 mL | Freq: Once | INTRAVENOUS | Status: AC
  Administered 2020-03-27: 1000 mL via INTRAVENOUS

## 2020-03-27 MED ORDER — PROCHLORPERAZINE MALEATE 10 MG PO TABS
10.0000 mg | ORAL_TABLET | Freq: Three times a day (TID) | ORAL | 0 refills | Status: DC | PRN
Start: 2020-03-27 — End: 2020-08-04

## 2020-03-27 NOTE — ED Notes (Signed)
NAD noted at time of D/C. Pt denies questions or concerns. Pt ambulatory to the lobby at this time.  

## 2020-03-27 NOTE — ED Notes (Signed)
Pt transported to CT at this time.

## 2020-03-27 NOTE — ED Provider Notes (Signed)
[facility] Emergency Department Provider Note    First MD Initiated Contact with Patient 03/27/20 1359     (approximate)  I have reviewed the triage vital signs and the nursing notes.   HISTORY  Chief Complaint Dizziness and Arm Pain    HPI Dana Shepherd is a 34 y.o. female bolus past medical history presents to the ER for concern over elevated blood pressure as well as intermittent dizziness headache and hand tingling has been going on for past several months.  She works as a Pharmacist, hospital.  Started feeling dizzy today talk to the school nurse who said her blood pressure was elevated 100 be evaluated.  When checking in triage she was reporting some chest pain and arm discomfort that has since resolved.  No diaphoresis.  She has been compliant with her HCTZ.  States that does have a mild headache.  Was not sudden onset.  Similar to previous and is not the worst headache of her life.  No fevers.    Past Medical History:  Diagnosis Date  . Dermoid cyst of ovary 2019  . Hypertension   . Hypothyroidism (acquired)   . Obesity    sleep apnea   Family History  Problem Relation Age of Onset  . Diabetes Mother        DM Type 2  . Multiple myeloma Mother 35  . Hypertension Father   . Diabetes Sister        DM Type 2  . Hypertension Sister    Past Surgical History:  Procedure Laterality Date  . CESAREAN SECTION  2012  . TONSILLECTOMY     Patient Active Problem List   Diagnosis Date Noted  . Dermoid cyst of right ovary 11/23/2019  . Essential hypertension 11/23/2019      Prior to Admission medications   Medication Sig Start Date End Date Taking? Authorizing Provider  Cholecalciferol 25 MCG (1000 UT) tablet Take by mouth.    [provider]  cyanocobalamin 1000 MCG tablet Take by mouth.    [provider]  Drospirenone (SLYND) 4 MG TABS Take 1 tablet by mouth daily. 2 SAMPLES GIVEN 0/7/37   Copland, Elmo Putt B, PA-C    hydrochlorothiazide (HYDRODIURIL) 25 MG tablet TAKE 1 TABLET BY MOUTH EVERY DAY 05/01/18   [provider]  ibuprofen (ADVIL,MOTRIN) 200 MG tablet Take by mouth.    [provider]  meloxicam (MOBIC) 15 MG tablet Take by mouth. 11/16/19 11/15/20  [provider]    Allergies Hydrocodone    Social History Social History   Tobacco Use  . Smoking status: Never Smoker  . Smokeless tobacco: Never Used  Substance Use Topics  . Alcohol use: Yes  . Drug use: Never    Review of Systems Patient denies headaches, rhinorrhea, blurry vision, numbness, shortness of breath, chest pain, edema, cough, abdominal pain, nausea, vomiting, diarrhea, dysuria, fevers, rashes or hallucinations unless otherwise stated above in HPI. ____________________________________________   PHYSICAL EXAM:  VITAL SIGNS: Vitals:   03/27/20 1208 03/27/20 1408  BP: (!) 153/107 (!) 152/98  Pulse: (!) 104 (!) 113  Resp: 16 18  Temp: 98.3 F (36.8 C)   SpO2: 97% 98%    Constitutional: Alert and oriented.  Eyes: Conjunctivae are normal.  Head: Atraumatic. Nose: No congestion/rhinnorhea. Mouth/Throat: Mucous membranes are moist.   Neck: No stridor. Painless ROM.  Cardiovascular: Normal rate, regular rhythm. Grossly normal heart sounds.  Good peripheral circulation. Respiratory: Normal respiratory effort.  No retractions. Lungs CTAB.  Gastrointestinal: Soft and nontender. No distention. No abdominal bruits. No CVA tenderness. Genitourinary:  Musculoskeletal: No lower extremity tenderness nor edema.  No joint effusions. Neurologic: CN- intact.  No facial droop, Normal FNF.  Normal heel to shin.  Sensation intact bilaterally. Normal speech and language. No gross focal neurologic deficits are appreciated. No gait instability. Skin:  Skin is warm, dry and intact. No rash noted. Psychiatric: Mood and affect are normal. Speech and behavior are  normal.  ____________________________________________   LABS (all labs ordered are listed, but only abnormal results are displayed)  Results for orders placed or performed during the hospital encounter of 03/27/20 (from the past 24 hour(s))  Basic metabolic panel     Status: None   Collection Time: 03/27/20 12:22 PM  Result Value Ref Range   Sodium 135 135 - 145 mmol/L   Potassium 3.6 3.5 - 5.1 mmol/L   Chloride 103 98 - 111 mmol/L   CO2 24 22 - 32 mmol/L   Glucose, Bld 98 70 - 99 mg/dL   BUN 13 6 - 20 mg/dL   Creatinine, Ser 0.64 0.44 - 1.00 mg/dL   Calcium 10.3 8.9 - 10.3 mg/dL   GFR calc non Af Amer >60 >60 mL/min   GFR calc Af Amer >60 >60 mL/min   Anion gap 8 5 - 15  CBC     Status: Abnormal   Collection Time: 03/27/20 12:22 PM  Result Value Ref Range   WBC 11.0 (H) 4.0 - 10.5 K/uL   RBC 5.37 (H) 3.87 - 5.11 MIL/uL   Hemoglobin 15.9 (H) 12.0 - 15.0 g/dL   HCT 44.7 36.0 - 46.0 %   MCV 83.2 80.0 - 100.0 fL   MCH 29.6 26.0 - 34.0 pg   MCHC 35.6 30.0 - 36.0 g/dL   RDW 12.3 11.5 - 15.5 %   Platelets 430 (H) 150 - 400 K/uL   nRBC 0.0 0.0 - 0.2 %   ____________________________________________  EKG My review and personal interpretation at Time: 12:15   Indication: htn  Rate: 110  Rhythm: sinus Axis: normal Other: normal intervals, nonspecific t wave abn, no stemi ____________________________________________  RADIOLOGY  I personally reviewed all radiographic images ordered to evaluate for the above acute complaints and reviewed radiology reports and findings.  These findings were personally discussed with the patient.  Please see medical record for radiology report.  ____________________________________________   PROCEDURES  Procedure(s) performed:  Procedures    Critical Care performed: no ____________________________________________   INITIAL IMPRESSION / ASSESSMENT AND PLAN / ED COURSE  Pertinent labs & imaging results that were available during my care  of the patient were reviewed by me and considered in my medical decision making (see chart for details).   DDX: Hypertension, essential hypertension, hypertensive urgency, CVA, mass, CVT, CHF, ACS, vertigo, migraine, tension  Dana Shepherd is a 34 y.o. who presents to the ED with symptoms as described above.  Patient is quite well-appearing pleasant no acute distress.  Has a completely benign neuro exam but mildly tachycardic and given her duration symptoms will order neuro imaging.  We will also send for D-dimer.  I will lower suspicion for ACS.  Will give fluid and migraine cocktail.  Clinical Course as of Mar 27 1712  Mon Mar 27, 2020  1652 Patient feels significantly improved.  D-dimer is negative.  Not consistent with PE or DVT or CVT.  Headache resolved.  Blood pressure resolved after headache was treated.  This not consistent with ICH or  SAH.  Likely tension or migraine headaches.  Will send prescription for Compazine.  Discussed need for follow-up with PCP.  Have discussed with the patient and available family all diagnostics and treatments performed thus far and all questions were answered to the best of my ability. The patient demonstrates understanding and agreement with plan.    [PR]    Clinical Course User Index [PR] Merlyn Lot, MD    The patient was evaluated in Emergency Department today for the symptoms described in the history of present illness. He/she was evaluated in the context of the global COVID-19 pandemic, which necessitated consideration that the patient might be at risk for infection with the SARS-CoV-2 virus that causes COVID-19. Institutional protocols and algorithms that pertain to the evaluation of patients at risk for COVID-19 are in a state of rapid change based on information released by regulatory bodies including the CDC and federal and state organizations. These policies and algorithms were followed during the patient's care in the ED.  As part of my  medical decision making, I reviewed the following data within the [facility] notes reviewed and incorporated, Labs reviewed, notes from prior ED visits and  Controlled Substance Database   ____________________________________________   FINAL CLINICAL IMPRESSION(S) / ED DIAGNOSES  Final diagnoses:  Hypertension, unspecified type  Nonintractable episodic headache, unspecified headache type  Dizziness      NEW MEDICATIONS STARTED DURING THIS VISIT:  New Prescriptions   No medications on file     Note:  This document was prepared using Dragon voice recognition software and may include unintentional dictation errors.    Merlyn Lot, MD 03/27/20 [phone]

## 2020-03-27 NOTE — ED Triage Notes (Signed)
Has been having dizziness for months.  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  Also says has some radiating pain in left arm into left pinky.

## 2020-03-27 NOTE — ED Notes (Signed)
Lab called to stated would need recollect on blue top. This RN requested lab come to recollect.

## 2020-03-27 NOTE — Discharge Instructions (Signed)

## 2020-05-17 ENCOUNTER — Encounter: Payer: Self-pay | Admitting: Obstetrics and Gynecology

## 2020-08-04 ENCOUNTER — Encounter: Payer: Self-pay | Admitting: Emergency Medicine

## 2020-08-04 ENCOUNTER — Other Ambulatory Visit: Payer: Self-pay

## 2020-08-04 ENCOUNTER — Ambulatory Visit
Admission: EM | Admit: 2020-08-04 | Discharge: 2020-08-04 | Disposition: A | Discharge status: Home / Self Care | Payer: BC Managed Care – PPO | Attending: Internal Medicine | Admitting: Internal Medicine

## 2020-08-04 DIAGNOSIS — Z20822 Contact with and (suspected) exposure to covid-19: Secondary | ICD-10-CM | POA: Insufficient documentation

## 2020-08-04 DIAGNOSIS — J309 Allergic rhinitis, unspecified: Secondary | ICD-10-CM | POA: Diagnosis present

## 2020-08-04 LAB — GROUP A STREP BY PCR: Group A Strep by PCR: NOT DETECTED

## 2020-08-04 NOTE — ED Provider Notes (Signed)
[facility]    CSN: [id number] Arrival date & time: 08/04/20  1228      History   Chief Complaint Chief Complaint  Patient presents with  . Sore Throat  . Nasal Congestion  . Sinus Problem    HPI Dana Shepherd is a 34 y.o. female who presents with onset of sinus congestion, pressure, rhinitis, ST which started 2 days ago. Denies a fever. Denies a cough, but has had sneezing, itchy throat and ears. Has not taken any meds right for this   Has had a team mate at work with + Covid test who had similar symptoms. Her daily rapid  Covid test have been negative every am when she went to work this week. Denies loss of taste or smell   Past Medical History:  Diagnosis Date  . Dermoid cyst of ovary 2019  . Hypertension   . Hypothyroidism (acquired)   . Obesity    sleep apnea    Patient Active Problem List   Diagnosis Date Noted  . Dermoid cyst of right ovary 11/23/2019  . Essential hypertension 11/23/2019    Past Surgical History:  Procedure Laterality Date  . CESAREAN SECTION  2012  . TONSILLECTOMY      OB History    Gravida  1   Para  1   Term  1   Preterm      AB      Living  1     SAB      TAB      Ectopic      Multiple      Live Births  1            Home Medications    Prior to Admission medications   Medication Sig Start Date End Date Taking? Authorizing Provider  Cholecalciferol 25 MCG (1000 UT) tablet Take by mouth.   Yes [provider]  cyanocobalamin 1000 MCG tablet Take by mouth.   Yes [provider]  fluticasone (FLONASE) 50 MCG/ACT nasal spray Place 1 spray into both nostrils 2 (two) times daily. 07/21/20  Yes [provider]  hydrochlorothiazide (HYDRODIURIL) 25 MG tablet TAKE 1 TABLET BY MOUTH EVERY DAY 05/01/18  Yes [provider]  ibuprofen (ADVIL,MOTRIN) 200 MG tablet Take by mouth.   Yes [provider]  levonorgestrel-ethinyl estradiol (SEASONALE) 0.15-0.03 MG tablet  Take 1 tablet by mouth daily. 07/21/20  Yes [provider]  metoprolol succinate (TOPROL-XL) 25 MG 24 hr tablet Take 25 mg by mouth daily. 07/21/20  Yes [provider]  Drospirenone (SLYND) 4 MG TABS Take 1 tablet by mouth daily. 2 SAMPLES GIVEN 06/20/40   Copland, Elmo Putt B, PA-C  prochlorperazine (COMPAZINE) 10 MG tablet Take 1 tablet (10 mg total) by mouth every 8 (eight) hours as needed for nausea or vomiting (headache). 03/27/20 08/04/20  Merlyn Lot, MD    Family History Family History  Problem Relation Age of Onset  . Diabetes Mother        DM Type 2  . Multiple myeloma Mother 27  . Hypertension Father   . Diabetes Sister        DM Type 2  . Hypertension Sister     Social History Social History   Tobacco Use  . Smoking status: Never Smoker  . Smokeless tobacco: Never Used  Vaping Use  . Vaping Use: Never used  Substance Use Topics  . Alcohol use: Yes  . Drug use: Never    Allergies  Hydrocodone   Review of Systems Review of Systems   Physical Exam Triage Vital Signs ED Triage Vitals  Enc Vitals Group     BP 08/04/20 1336 (!) 150/96     Pulse Rate 08/04/20 1336 89     Resp 08/04/20 1336 16     Temp 08/04/20 1336 98.7 F (37.1 C)     Temp Source 08/04/20 1336 Oral     SpO2 08/04/20 1336 100 %     Weight 08/04/20 1332 250 lb (113.4 kg)     Height 08/04/20 1332 _0  (1.702 m)     Head Circumference --      Peak Flow --      Pain Score 08/04/20 1331 6     Pain Loc --      Pain Edu? --      Excl. in [place]? --    No data found.  Updated Vital Signs BP (!) 150/96 (BP Location: Right Arm)   Pulse 89   Temp 98.7 F (37.1 C) (Oral)   Resp 16   Ht _1  (1.702 m)   Wt 250 lb (113.4 kg)   LMP 07/28/2020 (Approximate)   SpO2 100%   BMI 39.16 kg/m   Visual Acuity Right Eye Distance:   Left Eye Distance:   Bilateral Distance:    Right Eye Near:   Left Eye Near:    Bilateral Near:     Physical Exam Physical Exam Vitals signs  and nursing note reviewed.  Constitutional:      General: She is not in acute distress.    Appearance: Normal appearance. She is not ill-appearing, toxic-appearing or diaphoretic.  HENT:     Head: Normocephalic.     Right Ear: Tympanic membrane, ear canal and external ear normal.     Left Ear: Tympanic membrane, ear canal and external ear normal.     Nose: Nose normal. Has mild tenderness on L maxillary sinus area.     Mouth/Throat: with mild erythematous pharynx. Uvula is mid line and there is no exudates.     Mouth: Mucous membranes are moist.  Eyes:     General: No scleral icterus.       Right eye: No discharge.        Left eye: No discharge.     Conjunctiva/sclera: Conjunctivae normal.  Neck:     Musculoskeletal: Neck supple. No neck rigidity.  Cardiovascular:     Rate and Rhythm: Normal rate and regular rhythm.     Heart sounds: No murmur.  Pulmonary:     Effort: Pulmonary effort is normal.     Breath sounds: Normal breath sounds.  Abdominal:     General: Bowel sounds are normal. There is no distension.     Palpations: Abdomen is soft. There is no mass.     Tenderness: There is no abdominal tenderness. There is no guarding or rebound.     Hernia: No hernia is present.  Musculoskeletal: Normal range of motion.  Lymphadenopathy:     Cervical: No cervical adenopathy.  Skin:    General: Skin is warm and dry.     Coloration: Skin is not jaundiced.     Findings: No rash.  Neurological:     Mental Status: She is alert and oriented to person, place, and time.     Gait: Gait normal.  Psychiatric:        Mood and Affect: Mood normal.        Behavior: Behavior normal.  Thought Content: Thought content normal.        Judgment: Judgment normal.   UC Treatments / Results  Labs (all labs ordered are listed, but only abnormal results are displayed) Labs Reviewed  GROUP A STREP BY PCR  SARS CORONAVIRUS 2 (TAT 6-24 HRS)  Rapid strep test is neg.   EKG   Radiology No  results found.  Procedures Procedures (including critical care time)  Medications Ordered in UC Medications - No data to display  Initial Impression / Assessment and Plan / UC Course  I have reviewed the triage vital signs and the nursing notes. Pertinent labs  results that were available during my care of the patient were reviewed by me and considered in my medical decision making (see chart for details). Covid test is pending. She has Flonase at home and advised to use this for 7 days. Also to take Allegra or Claritin qd for allergic type symptoms.  See instructions.   Final Clinical Impressions(s) / UC Diagnoses   Final diagnoses:  Allergic rhinitis, unspecified seasonality, unspecified trigger     Discharge Instructions     You may take Allegra or Claritin for your current symptoms  If your Covid test ends up positive you may take the following supplements to help your immune system be stronger to fight this viral infection Take Quarcetin 500 mg three times a day x 7 days with Zinc 50 mg ones a day x 7 days. The quarcetin is an antiviral and anti-inflammatory supplement which helps open the zinc channels in the cell to absorb Zinc. Zinc helps decrease the virus load in your body. Take Melatonin 6-10 mg at bed time which also helps support your immune system.  Also make sure to take Vit D 5,000 IU per day with a fatty meal and Vit C 1000 mg a day until you are completely better. Stay on Vitamin D 2,000  and C the rest of the season.  Don't lay around, keep active and walk as much as you are able to to prevent worsening of your symptoms.  Follow up with your family Dr next week.  If you get short of breath and you are able to check  your oxygen with a pulse oxygen meter, if it gets to 92% or less, you need to go to the hospital to be admitted. If you dont have one, come back here and we will assess you.      ED Prescriptions    None     PDMP not reviewed this encounter.     Shelby Mattocks, [place] 08/04/20 1557

## 2020-08-04 NOTE — Discharge Instructions (Signed)
You may take Allegra or Claritin for your current symptoms  If your Covid test ends up positive you may take the following supplements to help your immune system be stronger to fight this viral infection Take Quarcetin 500 mg three times a day x 7 days with Zinc 50 mg ones a day x 7 days. The quarcetin is an antiviral and anti-inflammatory supplement which helps open the zinc channels in the cell to absorb Zinc. Zinc helps decrease the virus load in your body. Take Melatonin 6-10 mg at bed time which also helps support your immune system.  Also make sure to take Vit D 5,000 IU per day with a fatty meal and Vit C 1000 mg a day until you are completely better. Stay on Vitamin D 2,000  and C the rest of the season.  Don't lay around, keep active and walk as much as you are able to to prevent worsening of your symptoms.  Follow up with your family Dr next week.  If you get short of breath and you are able to check  your oxygen with a pulse oxygen meter, if it gets to 92% or less, you need to go to the hospital to be admitted. If you dont have one, come back here and we will assess you.

## 2020-08-04 NOTE — ED Triage Notes (Signed)
Patient c/o sinus congestion and pressure, runny nose, and sore throat that started 2 days ago.  Patient denies fevers.

## 2020-08-05 LAB — SARS CORONAVIRUS 2 (TAT 6-24 HRS): SARS Coronavirus 2: NEGATIVE

## 2020-12-13 ENCOUNTER — Ambulatory Visit: Payer: Self-pay | Admitting: Obstetrics and Gynecology

## 2021-04-17 ENCOUNTER — Other Ambulatory Visit: Payer: Self-pay | Admitting: Physician Assistant

## 2021-04-17 DIAGNOSIS — R197 Diarrhea, unspecified: Secondary | ICD-10-CM

## 2021-04-17 DIAGNOSIS — R1013 Epigastric pain: Secondary | ICD-10-CM

## 2021-04-17 DIAGNOSIS — R1011 Right upper quadrant pain: Secondary | ICD-10-CM

## 2021-04-17 DIAGNOSIS — R112 Nausea with vomiting, unspecified: Secondary | ICD-10-CM

## 2021-04-20 ENCOUNTER — Ambulatory Visit (HOSPITAL_COMMUNITY)
Admission: RE | Admit: 2021-04-20 | Discharge: 2021-04-20 | Disposition: A | Discharge status: Home / Self Care | Payer: BC Managed Care – PPO | Source: Ambulatory Visit | Attending: Physician Assistant | Admitting: Physician Assistant

## 2021-04-20 ENCOUNTER — Other Ambulatory Visit: Payer: Self-pay

## 2021-04-20 DIAGNOSIS — R197 Diarrhea, unspecified: Secondary | ICD-10-CM | POA: Diagnosis present

## 2021-04-20 DIAGNOSIS — R1013 Epigastric pain: Secondary | ICD-10-CM | POA: Diagnosis present

## 2021-04-20 DIAGNOSIS — R112 Nausea with vomiting, unspecified: Secondary | ICD-10-CM | POA: Diagnosis present

## 2021-04-20 DIAGNOSIS — R1011 Right upper quadrant pain: Secondary | ICD-10-CM | POA: Insufficient documentation

## 2021-08-07 ENCOUNTER — Ambulatory Visit
Admission: EM | Admit: 2021-08-07 | Discharge: 2021-08-07 | Disposition: A | Discharge status: Home / Self Care | Payer: BC Managed Care – PPO | Attending: Emergency Medicine | Admitting: Emergency Medicine

## 2021-08-07 ENCOUNTER — Other Ambulatory Visit: Payer: Self-pay

## 2021-08-07 DIAGNOSIS — J029 Acute pharyngitis, unspecified: Secondary | ICD-10-CM | POA: Diagnosis present

## 2021-08-07 DIAGNOSIS — Z20822 Contact with and (suspected) exposure to covid-19: Secondary | ICD-10-CM | POA: Insufficient documentation

## 2021-08-07 LAB — GROUP A STREP BY PCR: Group A Strep by PCR: NOT DETECTED

## 2021-08-07 LAB — SARS CORONAVIRUS 2 (TAT 6-24 HRS): SARS Coronavirus 2: NEGATIVE

## 2021-08-07 MED ORDER — BENZONATATE 200 MG PO CAPS: 200.0000 mg | ORAL_CAPSULE | Freq: Three times a day (TID) | ORAL | 0 refills | Status: DC | PRN

## 2021-08-07 MED ORDER — IBUPROFEN 600 MG PO TABS: 600.0000 mg | ORAL_TABLET | Freq: Four times a day (QID) | ORAL | 0 refills | Status: DC | PRN

## 2021-08-07 MED ORDER — FLUTICASONE PROPIONATE 50 MCG/ACT NA SUSP: 1.0000 | Freq: Two times a day (BID) | NASAL | 0 refills | Status: AC

## 2021-08-07 NOTE — ED Triage Notes (Signed)
Pt c/o cough, sore throat since yesterday. Pt states she took an at-home COVID test yesterday and it was negative. Pt also reports some mild nausea this morning. 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

## 2021-08-07 NOTE — ED Provider Notes (Signed)
HPI  SUBJECTIVE:  Patient reports sore throat starting last night.  Sx worse with swallowing.  Sx better with nothing.  Has not tried anything for her symptoms.  States that her home COVID test was negative. No fever   No neck stiffness  + Cough + nasal congestion, rhinorrhea, postnasal drip No Myalgias No Headache No Rash  No loss of taste or smell No shortness of breath or difficulty breathing + nausea, no vomiting No diarrhea No abdominal pain     No Recent Strep, mono, COVID exposure She got the COVID booster No reflux sxs No Allergy sxs  No Breathing difficulty, voice changes, sensation of throat swelling shut No Drooling No Trismus No abx in past month.  No antipyretic in past 4-6 hrs Patient has a past medical history of hypertension and is status post tonsillectomy LMP: She is on continuous OCPs.  Denies possibility being pregnant PMD: [facility]   Past Medical History:  Diagnosis Date   Dermoid cyst of ovary 2019   Hypertension    Hypothyroidism (acquired)    Obesity    sleep apnea    Past Surgical History:  Procedure Laterality Date   CESAREAN SECTION  2012   TONSILLECTOMY      Family History  Problem Relation Age of Onset   Diabetes Mother        DM Type 2   Multiple myeloma Mother 57   Hypertension Father    Diabetes Sister        DM Type 2   Hypertension Sister     Social History   Tobacco Use   Smoking status: Never   Smokeless tobacco: Never  Vaping Use   Vaping Use: Never used  Substance Use Topics   Alcohol use: Yes   Drug use: Never    No current facility-administered medications for this encounter.  Current Outpatient Medications:    benzonatate (TESSALON) 200 MG capsule, Take 1 capsule (200 mg total) by mouth 3 (three) times daily as needed for cough., Disp: 30 capsule, Rfl: 0   Cholecalciferol 25 MCG (1000 UT) tablet, Take by mouth., Disp: , Rfl:    cyanocobalamin 1000 MCG tablet, Take by mouth., Disp: , Rfl:     hydrochlorothiazide (HYDRODIURIL) 25 MG tablet, TAKE 1 TABLET BY MOUTH EVERY DAY, Disp: , Rfl:    ibuprofen (ADVIL) 600 MG tablet, Take 1 tablet (600 mg total) by mouth every 6 (six) hours as needed., Disp: 30 tablet, Rfl: 0   levonorgestrel-ethinyl estradiol (SEASONALE) 0.15-0.03 MG tablet, Take 1 tablet by mouth daily., Disp: , Rfl:    metoprolol succinate (TOPROL-XL) 25 MG 24 hr tablet, Take 25 mg by mouth daily., Disp: , Rfl:    Drospirenone (SLYND) 4 MG TABS, Take 1 tablet by mouth daily. 2 SAMPLES GIVEN, Disp: 84 tablet, Rfl: 2   fluticasone (FLONASE) 50 MCG/ACT nasal spray, Place 1 spray into both nostrils 2 (two) times daily., Disp: 18 mL, Rfl: 0  Allergies  Allergen Reactions   Hydrocodone Itching and Swelling     ROS  As noted in HPI.   Physical Exam  BP (!) 142/94 (BP Location: Left Arm)   Pulse 88   Temp 98.4 F (36.9 C) (Oral)   Resp 18   Ht _0  (1.702 m)   Wt 113.4 kg   SpO2 100%   BMI 39.16 kg/m   Constitutional: Well developed, well nourished, no acute distress Eyes:  EOMI, conjunctiva normal bilaterally HENT: Normocephalic, atraumatic,mucus membranes moist. +  nasal  congestion, normal turbinates.  No maxillary, frontal sinus tenderness +  erythematous oropharynx tonsils.  surgically absent.  No cobblestoning, postnasal drip. Uvula midline.  Respiratory: Normal inspiratory effort, lungs clear bilaterally Cardiovascular: Normal rate, no murmurs, rubs, gallops GI: nondistended, nontender. No appreciable splenomegaly skin: No rash, skin intact Lymph:+ Anterior cervical LN.  No posterior cervical lymphadenopathy Musculoskeletal: no deformities Neurologic: Alert & oriented x 3, no focal neuro deficits Psychiatric: Speech and behavior appropriate.   ED Course   Medications - No data to display  Orders Placed This Encounter  Procedures   SARS CORONAVIRUS 2 (TAT 6-24 HRS) Nasopharyngeal Nasopharyngeal Swab    Standing Status:   Standing    Number of  Occurrences:   1    Order Specific Question:   Is this test for diagnosis or screening    Answer:   Diagnosis of ill patient    Order Specific Question:   Symptomatic for COVID-19 as defined by CDC    Answer:   Yes    Order Specific Question:   Date of Symptom Onset    Answer:   08/05/2021    Order Specific Question:   Hospitalized for COVID-19    Answer:   No    Order Specific Question:   Admitted to ICU for COVID-19    Answer:   No    Order Specific Question:   Previously tested for COVID-19    Answer:   Yes    Order Specific Question:   Resident in a congregate (group) care setting    Answer:   No    Order Specific Question:   Employed in healthcare setting    Answer:   No    Order Specific Question:   Pregnant    Answer:   No    Order Specific Question:   Has patient completed COVID vaccination(s) (2 doses of Pfizer/Moderna 1 dose of [employer])    Answer:   Yes    Order Specific Question:   Has patient completed COVID Booster / 3rd dose    Answer:   Yes   Group A Strep by PCR    Standing Status:   Standing    Number of Occurrences:   1    Results for orders placed or performed during the hospital encounter of 08/07/21 (from the past 24 hour(s))  Group A Strep by PCR     Status: None   Collection Time: 08/07/21  8:57 AM   Specimen: Throat; Sterile Swab  Result Value Ref Range   Group A Strep by PCR NOT DETECTED NOT DETECTED   Results for orders placed or performed during the hospital encounter of 08/07/21  SARS CORONAVIRUS 2 (TAT 6-24 HRS) Nasopharyngeal Nasopharyngeal Swab   Specimen: Nasopharyngeal Swab  Result Value Ref Range   SARS Coronavirus 2 NEGATIVE NEGATIVE  Group A Strep by PCR   Specimen: Throat; Sterile Swab  Result Value Ref Range   Group A Strep by PCR NOT DETECTED NOT DETECTED    No results found.  ED Clinical Impression  1. Viral pharyngitis   2. Encounter for laboratory testing for COVID-19 virus      ED Assessment/Plan  Strep PCR,  COVID.  Patient will be a candidate for antivirals based on hypertension and BMI.  We discussed both Paxlovid.  We are.  Patient has opted for Molnupiravir.  will contact patient at [phone] only if strep is positive and will call in the appropriate antibiotics  strep PCR negative. Patient home  with ibuprofen, Tylenol, Benadryl/Maalox mixture.. Patient to followup with PMD when necessary.   COVID-negative.  Supportive treatment.  Discussed labs,  MDM, plan and followup with patient. Discussed sn/sx that should prompt return to the ED. patient agrees with plan.   Meds ordered this encounter  Medications   fluticasone (FLONASE) 50 MCG/ACT nasal spray    Sig: Place 1 spray into both nostrils 2 (two) times daily.    Dispense:  18 mL    Refill:  0   ibuprofen (ADVIL) 600 MG tablet    Sig: Take 1 tablet (600 mg total) by mouth every 6 (six) hours as needed.    Dispense:  30 tablet    Refill:  0   benzonatate (TESSALON) 200 MG capsule    Sig: Take 1 capsule (200 mg total) by mouth 3 (three) times daily as needed for cough.    Dispense:  30 capsule    Refill:  0     *This clinic note was created using [employer]. Therefore, there may be occasional mistakes despite careful proofreading.     Melynda Ripple, MD 08/09/21 [phone]

## 2021-08-07 NOTE — Discharge Instructions (Addendum)
I will call you only if your strep PCR is positive.  I will call in antibiotics in that case.  Your COVID will be back in 6 to 24 hours.  I will call him Molnupiravir if it is positive.  In the meantime, 1 gram of Tylenol and 600 mg ibuprofen together 3-4 times a day as needed for pain.  Make sure you drink plenty of extra fluids.  Some people find salt water gargles and  Traditional Medicinal's "Throat Coat" tea helpful. Take 5 mL of liquid Benadryl and 5 mL of Maalox. Mix it together, and then hold it in your mouth for as long as you can and then swallow. You may do this 4 times a day.  Flonase, saline nasal irrigation, Mucinex D, Tessalon for the cough.  Go to www.goodrx.com  or www.costplusdrugs.com to look up your medications. This will give you a list of where you can find your prescriptions at the most affordable prices. Or ask the pharmacist what the cash price is, or if they have any other discount programs available to help make your medication more affordable. This can be less expensive than what you would pay with insurance.

## 2021-08-13 ENCOUNTER — Ambulatory Visit
Admission: EM | Admit: 2021-08-13 | Discharge: 2021-08-13 | Disposition: A | Discharge status: Home / Self Care | Payer: BC Managed Care – PPO

## 2021-08-13 ENCOUNTER — Other Ambulatory Visit: Payer: Self-pay

## 2021-08-13 DIAGNOSIS — H109 Unspecified conjunctivitis: Secondary | ICD-10-CM | POA: Diagnosis not present

## 2021-08-13 DIAGNOSIS — B9689 Other specified bacterial agents as the cause of diseases classified elsewhere: Secondary | ICD-10-CM | POA: Diagnosis not present

## 2021-08-13 DIAGNOSIS — J208 Acute bronchitis due to other specified organisms: Secondary | ICD-10-CM

## 2021-08-13 MED ORDER — SULFACETAMIDE SODIUM 10 % OP SOLN: 2.0000 [drp] | Freq: Three times a day (TID) | OPHTHALMIC | 0 refills | Status: DC

## 2021-08-13 MED ORDER — ALBUTEROL SULFATE HFA 108 (90 BASE) MCG/ACT IN AERS: 2.0000 | INHALATION_SPRAY | RESPIRATORY_TRACT | 0 refills | Status: DC | PRN

## 2021-08-13 NOTE — ED Provider Notes (Signed)
[facility]    CSN: [id number] Arrival date & time: 08/13/21  0800      History   Chief Complaint Chief Complaint  Patient presents with   Conjunctivitis    HPI Jacqualin Shirkey is a 35 y.o. female who presents with both eyes matted this am and continues to have yellow drainage. Denies eye pain. Does not wear contacts.  URI x 7  days and her cough is getting worse, has had a few wheezes. Denies fever. Was here 6 days ago.     Past Medical History:  Diagnosis Date   Dermoid cyst of ovary 2019   Hypertension    Hypothyroidism (acquired)    Obesity    sleep apnea    Patient Active Problem List   Diagnosis Date Noted   Dermoid cyst of right ovary 11/23/2019   Essential hypertension 11/23/2019    Past Surgical History:  Procedure Laterality Date   CESAREAN SECTION  2012   TONSILLECTOMY      OB History     Gravida  1   Para  1   Term  1   Preterm      AB      Living  1      SAB      IAB      Ectopic      Multiple      Live Births  1            Home Medications    Prior to Admission medications   Medication Sig Start Date End Date Taking? Authorizing Provider  albuterol (VENTOLIN HFA) 108 (90 Base) MCG/ACT inhaler Inhale 2 puffs into the lungs every 4 (four) hours as needed for wheezing or shortness of breath. 08/13/21  Yes Rodriguez-Southworth, Sunday Spillers, PA-C  hydrochlorothiazide (HYDRODIURIL) 25 MG tablet Take 1 tablet by mouth daily. 07/06/21  Yes [provider]  metoprolol succinate (TOPROL-XL) 25 MG 24 hr tablet Take 1 tablet by mouth daily. 07/07/21  Yes [provider]  sulfacetamide (BLEPH-10) 10 % ophthalmic solution Place 2 drops into both eyes 3 (three) times daily. X 7 days 08/13/21  Yes Rodriguez-Southworth, Sunday Spillers, PA-C  benzonatate (TESSALON) 200 MG capsule Take 1 capsule (200 mg total) by mouth 3 (three) times daily as needed for cough. 08/07/21   Melynda Ripple, MD  Cholecalciferol 25 MCG (1000 UT)  tablet Take by mouth.    [provider]  cyanocobalamin 1000 MCG tablet Take by mouth.    [provider]  Drospirenone (SLYND) 4 MG TABS Take 1 tablet by mouth daily. 2 SAMPLES GIVEN 04/19/21   Copland, Elmo Putt B, PA-C  fluticasone (FLONASE) 50 MCG/ACT nasal spray Place 1 spray into both nostrils 2 (two) times daily. 08/07/21   Melynda Ripple, MD  hydrochlorothiazide (HYDRODIURIL) 25 MG tablet TAKE 1 TABLET BY MOUTH EVERY DAY 05/01/18   [provider]  ibuprofen (ADVIL) 600 MG tablet Take 1 tablet (600 mg total) by mouth every 6 (six) hours as needed. 08/07/21   Melynda Ripple, MD  levonorgestrel-ethinyl estradiol (SEASONALE) 0.15-0.03 MG tablet Take 1 tablet by mouth daily. 07/21/20   [provider]  metoprolol succinate (TOPROL-XL) 25 MG 24 hr tablet Take 25 mg by mouth daily. 07/21/20   [provider]  prochlorperazine (COMPAZINE) 10 MG tablet Take 1 tablet (10 mg total) by mouth every 8 (eight) hours as needed for nausea or vomiting (headache). 03/27/20 08/04/20  Merlyn Lot, MD    Family History Family History  Problem  Relation Age of Onset   Diabetes Mother        DM Type 2   Multiple myeloma Mother 50   Hypertension Father    Diabetes Sister        DM Type 2   Hypertension Sister     Social History Social History   Tobacco Use   Smoking status: Never   Smokeless tobacco: Never  Vaping Use   Vaping Use: Never used  Substance Use Topics   Alcohol use: Yes    Comment: social   Drug use: Never     Allergies   Hydrocodone   Review of Systems Review of Systems  HENT:  Positive for congestion and rhinorrhea.   Eyes:  Positive for discharge, redness and itching. Negative for photophobia, pain and visual disturbance.  Respiratory:  Positive for cough and wheezing. Negative for chest tightness and shortness of breath.   Cardiovascular:  Negative for chest pain.  Psychiatric/Behavioral:  Negative for agitation.      Physical Exam Triage Vital Signs ED Triage Vitals  Enc Vitals Group     BP 08/13/21 0820 (!) 157/103     Pulse Rate 08/13/21 0820 93     Resp 08/13/21 0820 17     Temp 08/13/21 0820 98.7 F (37.1 C)     Temp Source 08/13/21 0820 Oral     SpO2 08/13/21 0820 100 %     Weight 08/13/21 0817 249 lb 12.5 oz (113.3 kg)     Height 08/13/21 0817 '5\' 7"'  (1.702 m)     Head Circumference --      Peak Flow --      Pain Score 08/13/21 0815 3     Pain Loc --      Pain Edu? --      Excl. in [place]? --    No data found.  Updated Vital Signs BP (!) 157/103 (BP Location: Left Arm) Comment: has not taken her 2 b/p meds yet today  Pulse 93   Temp 98.7 F (37.1 C) (Oral)   Resp 17   Ht '5\' 7"'  (1.702 m)   Wt 249 lb 12.5 oz (113.3 kg)   LMP 05/29/2021   SpO2 100%   BMI 39.12 kg/m   Visual Acuity Right Eye Distance:   Left Eye Distance:   Bilateral Distance:    Right Eye Near:   Left Eye Near:    Bilateral Near:     Physical Exam Physical Exam Vitals signs and nursing note reviewed.  Constitutional:      General: She is not in acute distress.    Appearance: Normal appearance. She is not ill-appearing, toxic-appearing or diaphoretic.  HEENT:  Eyes- with injection on the L, some crusting in both lashes noted     Head: Normocephalic.     Right Ear: Tympanic membrane, ear canal and external ear normal.     Left Ear: Tympanic membrane, ear canal and external ear normal.     Nose: Nose normal.     Mouth/Throat:     Mouth: Mucous membranes are moist.       Conjunctiva/sclera: Conjunctivae normal.  Neck:     Musculoskeletal: Neck supple. No neck rigidity.  Cardiovascular:     Rate and Rhythm: Normal rate and regular rhythm.     Heart sounds: No murmur.  Pulmonary:     Effort: Pulmonary effort is normal.     Breath sounds: with mild expiratory wheezing on bases   Musculoskeletal: Normal range of motion.  Lymphadenopathy:     Cervical: No cervical adenopathy.  Skin:    General:  Skin is warm and dry.     Coloration: Skin is not jaundiced.     Findings: No rash.  Neurological:     Mental Status: She is alert and oriented to person, place, and time.     Gait: Gait normal.  Psychiatric:        Mood and Affect: Mood normal.        Behavior: Behavior normal.        Thought Content: Thought content normal.        Judgment: Judgment normal.    UC Treatments / Results  Labs (all labs ordered are listed, but only abnormal results are displayed) Labs Reviewed - No data to display  EKG   Radiology No results found.  Procedures Procedures (including critical care time)  Medications Ordered in UC Medications - No data to display  Initial Impression / Assessment and Plan / UC Course  I have reviewed the triage vital signs and the nursing notes. Bacterial conjunctivitis and Viral bronchitis.   I placed her on Sodium sulamid ophthalmic gtts and albuterol inhaler as noted.  See instructions.    Final Clinical Impressions(s) / UC Diagnoses   Final diagnoses:  Viral bronchitis  Bacterial conjunctivitis of both eyes     Discharge Instructions      Come back if you develop a fever of 100.5 or more, or the wheezing gets worse.  Netie Pot saline rinses instructions: Do not use tap water, only boiled water that has been cooled down.  Do it twice a day  for 5-7 days, but avoid bed time.        ED Prescriptions     Medication Sig Dispense Auth. Provider   albuterol (VENTOLIN HFA) 108 (90 Base) MCG/ACT inhaler Inhale 2 puffs into the lungs every 4 (four) hours as needed for wheezing or shortness of breath. 18 g Rodriguez-Southworth, Sunday Spillers, PA-C   sulfacetamide (BLEPH-10) 10 % ophthalmic solution Place 2 drops into both eyes 3 (three) times daily. X 7 days 15 mL Rodriguez-Southworth, Sunday Spillers, PA-C      PDMP not reviewed this encounter.   Shelby Mattocks, PA-C 08/13/21 1400

## 2021-08-13 NOTE — Discharge Instructions (Signed)
Come back if you develop a fever of 100.5 or more, or the wheezing gets worse.  Netie Pot saline rinses instructions: Do not use tap water, only boiled water that has been cooled down.  Do it twice a day  for 5-7 days, but avoid bed time.

## 2021-08-13 NOTE — ED Triage Notes (Addendum)
Pt here last week for cough and sore throat. Now woke up with both eyes matted shut and mildly reddened. Pt states she would also like a stronger cough medication  VISUAL ACUITY Right eye uncorrected 20/30 Left eye uncorrected 20/30 Both eyes uncorrected 20/30

## 2021-10-31 NOTE — Progress Notes (Addendum)
PCP:  Marinda Elk, MD   Chief Complaint  Patient presents with   Gynecologic Exam    Missed cycle in Sept, last one in June     HPI:      [name] is a 35 y.o. G1P1001 who LMP was No LMP recorded. (Menstrual status: Oral contraceptives)., presents today for her annual examination.  Her menses are Q3 months with OCPs, lasting 4 days.  Dysmenorrhea none, no BTB. Didn't have 9/22 menses but was really sick in Sept, no UPT done. Pt changed to slynd OCPs last yr by me due to hx of HTN but wasn't covered with insurance and too expensive. PCP refilled seasonale.   Sex activity: single partner, contraception - OCP (estrogen/progesterone).  Pt had Mirena IUD PP and removed due to bleeding/clotting due to malposition.  She has HTN and is on HCTZ and now toprol due to uncontrolled BP. Lost 40# in past and was trying to come of HCTZ, but gained wt back with covid and back on meds. Has headaches when not controlled.  PCP has been prescribing OCPs and HCTZ.  Last Pap: 08/12/18 Results: no abnormalities/neg HPV DNA Hx of STDs: none  Hx of 5 cm RT ovar dermoid cyst. Had u/s f/u 11/19 and 1/21 and cyst is stable in size. Pt having some episodic RLQ discomfort now. Last yr discussed surg vs repeat u/s for this yr and pt elected to repeat u/s.    Last mammogram: 09/01/18 at [facility]; results were normal, repeat age 62. Hx of  RT breast mass that was normal fibroglandular tissue and fat lobules on mammo and u/s.  Had mammos at [facility] in past due to fibrocystic breasts. There is no FH of breast cancer. There is no FH of ovarian cancer. The patient does do self-breast exams.   Tobacco use: The patient denies current or previous tobacco use. Alcohol use: social No drug use.  Exercise: min active; plans to restart [employer]  She does get adequate calcium but not Vitamin D in her diet. Hx of Vit D deficiency Labs with PCP.  Past Medical History:  Diagnosis Date   Dermoid cyst of ovary  2019   Hypertension    Hypothyroidism (acquired)    Obesity    sleep apnea    Past Surgical History:  Procedure Laterality Date   CESAREAN SECTION  2012   TONSILLECTOMY      Family History  Problem Relation Age of Onset   Diabetes Mother        DM Type 2   Multiple myeloma Mother 19   Hypertension Father    Diabetes Sister        DM Type 2   Hypertension Sister     Social History   Socioeconomic History   Marital status: Single    Spouse name: Not on file   Number of children: Not on file   Years of education: Not on file   Highest education level: Not on file  Occupational History   Not on file  Tobacco Use   Smoking status: Never   Smokeless tobacco: Never  Vaping Use   Vaping Use: Never used  Substance and Sexual Activity   Alcohol use: Yes    Comment: social   Drug use: Never   Sexual activity: Yes    Birth control/protection: Pill  Other Topics Concern   Not on file  Social History Narrative   Not on file   Social Determinants of Health  Financial Resource Strain: Not on file  Food Insecurity: Not on file  Transportation Needs: Not on file  Physical Activity: Not on file  Stress: Not on file  Social Connections: Not on file  Intimate Partner Violence: Not on file    Outpatient Medications Prior to Visit  Medication Sig Dispense Refill   fluticasone (FLONASE) 50 MCG/ACT nasal spray Place 1 spray into both nostrils 2 (two) times daily. 18 mL 0   hydrochlorothiazide (HYDRODIURIL) 25 MG tablet Take 1 tablet by mouth daily.     ibuprofen (ADVIL) 600 MG tablet Take 1 tablet (600 mg total) by mouth every 6 (six) hours as needed. 30 tablet 0   metoprolol succinate (TOPROL-XL) 50 MG 24 hr tablet Take 50 mg by mouth daily.     levonorgestrel-ethinyl estradiol (SEASONALE) 0.15-0.03 MG tablet Take 1 tablet by mouth daily.     albuterol (VENTOLIN HFA) 108 (90 Base) MCG/ACT inhaler Inhale 2 puffs into the lungs every 4 (four) hours as needed for wheezing or  shortness of breath. 18 g 0   benzonatate (TESSALON) 200 MG capsule Take 1 capsule (200 mg total) by mouth 3 (three) times daily as needed for cough. 30 capsule 0   Cholecalciferol 25 MCG (1000 UT) tablet Take by mouth.     cyanocobalamin 1000 MCG tablet Take by mouth.     Drospirenone (SLYND) 4 MG TABS Take 1 tablet by mouth daily. 2 SAMPLES GIVEN 84 tablet 2   hydrochlorothiazide (HYDRODIURIL) 25 MG tablet TAKE 1 TABLET BY MOUTH EVERY DAY     metoprolol succinate (TOPROL-XL) 25 MG 24 hr tablet Take 25 mg by mouth daily.     metoprolol succinate (TOPROL-XL) 25 MG 24 hr tablet Take 1 tablet by mouth daily.     sulfacetamide (BLEPH-10) 10 % ophthalmic solution Place 2 drops into both eyes 3 (three) times daily. X 7 days 15 mL 0   No facility-administered medications prior to visit.      ROS:  Review of Systems  Constitutional:  Negative for fatigue, fever and unexpected weight change.  Respiratory:  Negative for cough, shortness of breath and wheezing.   Cardiovascular:  Negative for chest pain, palpitations and leg swelling.  Gastrointestinal:  Negative for blood in stool, constipation, diarrhea, nausea and vomiting.  Endocrine: Negative for cold intolerance, heat intolerance and polyuria.  Genitourinary:  Positive for pelvic pain. Negative for dyspareunia, dysuria, flank pain, frequency, genital sores, hematuria, menstrual problem, urgency, vaginal bleeding, vaginal discharge and vaginal pain.  Musculoskeletal:  Negative for back pain, joint swelling and myalgias.  Skin:  Negative for rash.  Neurological:  Negative for dizziness, syncope, light-headedness, numbness and headaches.  Hematological:  Negative for adenopathy.  Psychiatric/Behavioral:  Negative for agitation, confusion, sleep disturbance and suicidal ideas. The patient is not nervous/anxious.  BREAST: mass   Objective: BP 140/90    Ht '5\' 7"'  (1.702 m)    Wt 261 lb (118.4 kg)    BMI 40.88 kg/m  PT DIDN'T TAKE BP MEDS  YESTERDAY SO BP UP TODAY  Physical Exam Constitutional:      Appearance: She is well-developed.  Genitourinary:     Vulva normal.     Right Labia: No rash, tenderness or lesions.    Left Labia: No tenderness, lesions or rash.    No vaginal discharge, erythema or tenderness.      Right Adnexa: not tender and no mass present.    Left Adnexa: not tender and no mass present.    No  cervical friability or polyp.     Uterus is not enlarged or tender.  Breasts:    Right: No mass, nipple discharge, skin change or tenderness.     Left: No mass, nipple discharge, skin change or tenderness.  Neck:     Thyroid: No thyromegaly.  Cardiovascular:     Rate and Rhythm: Normal rate and regular rhythm.     Heart sounds: Normal heart sounds. No murmur heard. Pulmonary:     Effort: Pulmonary effort is normal.     Breath sounds: Normal breath sounds.  Abdominal:     Palpations: Abdomen is soft.     Tenderness: There is no abdominal tenderness. There is no guarding or rebound.  Musculoskeletal:        General: Normal range of motion.     Cervical back: Normal range of motion.  Lymphadenopathy:     Cervical: No cervical adenopathy.  Neurological:     General: No focal deficit present.     Mental Status: She is alert and oriented to person, place, and time.     Cranial Nerves: No cranial nerve deficit.  Skin:    General: Skin is warm and dry.  Psychiatric:        Mood and Affect: Mood normal.        Behavior: Behavior normal.        Thought Content: Thought content normal.        Judgment: Judgment normal.  Vitals reviewed.   Results for orders placed or performed in visit on 11/01/21 (from the past 24 hour(s))  POCT urine pregnancy     Status: Normal   Collection Time: 11/01/21 10:06 AM  Result Value Ref Range   Preg Test, Ur Negative Negative    Assessment/Plan: Encounter for annual routine gynecological examination  Encounter for surveillance of contraceptive pills - Plan:  norethindrone (MICRONOR) 0.35 MG tablet; will change to POPs due to HTN and risks with estrogen OCPs. Rx camila (which should be free with insurance). F/u prn.   Amenorrhea - Plan: POCT urine pregnancy; neg UPT today. Reassurance re: no menses 9/22, cont OCPs. F/u prn.   Essential hypertension--recent dose change with improvement, followed by PCP. Pt plans to restart [employer]  Dermoid cyst of right ovary - Plan: US PELVIC COMPLETE WITH TRANSVAGINAL; check GYN u/s. Stable past few yrs; given size, may need surg removal.           Meds ordered this encounter  Medications   norethindrone (MICRONOR) 0.35 MG tablet    Sig: Take 1 tablet (0.35 mg total) by mouth daily.    Dispense:  84 tablet    Refill:  3    Order Specific Question:   Supervising Provider    Answer:   Gae Dry [[id number]]    GYN counsel adequate intake of calcium and vitamin D, diet and exercise     F/U  Return in about 1 year (around 11/01/2022).  Axavier Pressley B. Dynastie Knoop, PA-C 11/01/2021 9:30 AM

## 2021-11-01 ENCOUNTER — Other Ambulatory Visit: Payer: Self-pay

## 2021-11-01 ENCOUNTER — Ambulatory Visit (INDEPENDENT_AMBULATORY_CARE_PROVIDER_SITE_OTHER): Payer: BC Managed Care – PPO | Admitting: Obstetrics and Gynecology

## 2021-11-01 ENCOUNTER — Encounter: Payer: Self-pay | Admitting: Obstetrics and Gynecology

## 2021-11-01 VITALS — BP 140/90 | Ht 67.0 in | Wt 261.0 lb

## 2021-11-01 DIAGNOSIS — Z01419 Encounter for gynecological examination (general) (routine) without abnormal findings: Secondary | ICD-10-CM | POA: Diagnosis not present

## 2021-11-01 DIAGNOSIS — I10 Essential (primary) hypertension: Secondary | ICD-10-CM

## 2021-11-01 DIAGNOSIS — Z3041 Encounter for surveillance of contraceptive pills: Secondary | ICD-10-CM | POA: Diagnosis not present

## 2021-11-01 DIAGNOSIS — N912 Amenorrhea, unspecified: Secondary | ICD-10-CM

## 2021-11-01 DIAGNOSIS — D270 Benign neoplasm of right ovary: Secondary | ICD-10-CM

## 2021-11-01 LAB — POCT URINE PREGNANCY: Preg Test, Ur: NEGATIVE

## 2021-11-01 MED ORDER — NORETHINDRONE 0.35 MG PO TABS
1.0000 | ORAL_TABLET | Freq: Every day | ORAL | 3 refills | Status: DC
Start: 2021-11-01 — End: 2022-10-18

## 2021-11-01 NOTE — Addendum Note (Signed)
Addended by: Ardeth Perfect B on: [id number]/5625 10:06 AM   Modules accepted: Orders

## 2021-11-01 NOTE — Patient Instructions (Signed)
I value your feedback and you entrusting us with your care. If you get a [facility] patient survey, I would appreciate you taking the time to let us know about your experience today. Thank you! ? ? ?

## 2021-11-13 ENCOUNTER — Ambulatory Visit
Admission: RE | Admit: 2021-11-13 | Discharge: 2021-11-13 | Disposition: A | Discharge status: Home / Self Care | Payer: BC Managed Care – PPO | Source: Ambulatory Visit | Attending: Obstetrics and Gynecology | Admitting: Obstetrics and Gynecology

## 2021-11-13 ENCOUNTER — Other Ambulatory Visit: Payer: Self-pay

## 2021-11-13 DIAGNOSIS — D270 Benign neoplasm of right ovary: Secondary | ICD-10-CM | POA: Diagnosis not present

## 2021-11-15 NOTE — Progress Notes (Signed)
1/21 GYN u/s results showed Right Ovary  Is abnormal with a complex cyst, most likely dermoid, measuring 51.5 x 45.6 x 45.1 mm. Size is stable. Given large size, does anything need to be done? Pt is without sx.

## 2021-11-20 NOTE — Progress Notes (Signed)
Pls call pt to schedule surg consult with DR. Harris for dermoid. She is expecting your call. Thx.

## 2021-11-22 ENCOUNTER — Ambulatory Visit: Payer: BC Managed Care – PPO | Admitting: Obstetrics & Gynecology

## 2021-11-22 ENCOUNTER — Other Ambulatory Visit: Payer: Self-pay

## 2021-11-22 ENCOUNTER — Encounter: Payer: Self-pay | Admitting: Obstetrics & Gynecology

## 2021-11-22 VITALS — BP 120/80 | Ht 67.0 in | Wt 262.0 lb

## 2021-11-22 DIAGNOSIS — D270 Benign neoplasm of right ovary: Secondary | ICD-10-CM | POA: Diagnosis not present

## 2021-11-22 NOTE — Patient Instructions (Signed)
Ovarian Cystectomy Ovarian cystectomy is a procedure that is done to remove a fluid-filled sac (cyst) on an ovary. The ovaries are small organs that produce eggs in women. Various types of cysts can form on the ovaries. Most cysts are not cancerous. This procedure may be done for cysts that are large, cause symptoms, or do not go away on their own. It may also be done for a cyst that is cancerous or might be cancerous. This surgery can be done using a laparoscopic technique or an open abdominal technique. The laparoscopic technique is minimally invasive and results in smaller incisions and a faster recovery. The technique used will depend on your age, the type of cyst that you have, and whether the cyst is cancerous. The laparoscopic technique is not used for a cancerous cyst. Tell a health care provider about: Any allergies you have. All medicines you are taking, including vitamins, herbs, eye drops, creams, and over-the-counter medicines. Any problems you or family members have had with anesthetic medicines. Any blood disorders you have. Any surgeries you have had. Any medical conditions you have. Whether you are pregnant or may be pregnant. What are the risks? Generally, this is a safe procedure. However, problems may occur, including: Excessive bleeding. Infection. Damage to nearby structures or organs. Allergic reactions to medicines. Blood clots. Inability to get pregnant (infertility). What happens before the procedure? Staying hydrated Follow instructions from your health care provider about hydration, which may include: Up to 2 hours before the procedure - you may continue to drink clear liquids, such as water, clear fruit juice, black coffee, and plain tea.  Eating and drinking restrictions Follow instructions from your health care provider about eating and drinking, which may include: 8 hours before the procedure - stop eating heavy meals or foods, such as meat, fried foods, or  fatty foods. 6 hours before the procedure - stop eating light meals or foods, such as toast or cereal. 6 hours before the procedure - stop drinking milk or drinks that contain milk. 2 hours before the procedure - stop drinking clear liquids. Medicines Ask your health care provider about: Changing or stopping your regular medicines. This is especially important if you are taking diabetes medicines or blood thinners. Taking medicines such as aspirin and ibuprofen. These medicines can thin your blood. Do not take these medicines unless your health care provider tells you to take them. Taking over-the-counter medicines, vitamins, herbs, and supplements. General instructions Do not use any products that contain nicotine or tobacco for at least 4 weeks before the procedure. These products include cigarettes, e-cigarettes, and chewing tobacco. If you need help quitting, ask your health care provider. Ask your health care provider: How your surgery site will be marked. What steps will be taken to help prevent infection. These may include: Removing hair at the surgery site. Washing skin with a germ-killing soap. Taking antibiotic medicine. You may be asked to shower with a germ-killing soap. Plan to have someone take you home from the hospital or clinic. Plan to have someone help with household activities for 1-2 weeks after the procedure. Let your health care provider know if you develop a cold or any infection before your surgery. What happens during the procedure? An IV will be inserted into one of your veins. You will be given one or more of the following: A medicine to help you relax (sedative). A medicine to make you fall asleep (general anesthetic). Small monitors will be attached to your body. They will be  used to check your heart, blood pressure, and oxygen level. A breathing tube will be placed to help you breathe during the procedure. Your surgeon will do the surgery using either the  laparoscopic technique or the open abdominal technique. Laparoscopic technique  Several small incisions will be made in your abdomen. Your abdomen will be filled with carbon dioxide gas to make it expand. This will give the surgeon more room to operate. It will also make your organs easier to see. A thin scope with a camera (laparoscope) will be put through one of the small incisions. The laparoscope will send a picture to a monitor in the operating room to help the surgeon see inside your body. Hollow tubes will be put through the other small incisions in your abdomen. The tools needed for the procedure will be put through these tubes. The ovary with the cyst will be identified, and the cyst will be removed. The tools will then be removed, and the incisions will be closed with stitches or skin glue. Bandages (dressings) may be applied to the incisions. Open abdominal technique A single, large incision will be made along your bikini line or in the middle of your lower abdomen. The ovary with the cyst will be identified, and the cyst will be removed. The incision will then be closed with stitches or staples. Bandages (dressings) may be applied to the incisions. The procedure may vary among health care providers and hospitals. What happens after the procedure? Your blood pressure, heart rate, breathing rate, and blood oxygen level will be monitored until you leave the hospital or clinic. Your IV will be removed after you are able to eat and drink well. You may be given medicine for pain or to help you sleep. You may be given an antibiotic medicine. Do not drive for 24 hours if you were given a sedative during the procedure. The cyst that was removed will be sent to the lab for testing. If the cyst has cancer cells, both ovaries may need to be removed during a different surgery. It is up to you to get the results of your procedure. Ask your health care provider, or the department that is doing the  procedure, when your results will be ready. Summary Ovarian cystectomy is a procedure that is done to remove a cyst on an ovary. This procedure may be done for cysts that are large, cause symptoms, or do not go away on their own. It may also be done for a cyst that is cancerous or might be cancerous. Follow instructions from your health care provider about eating and drinking before the procedure. After the cyst is removed, it will be sent to the lab for testing. This information is not intended to replace advice given to you by your health care provider. Make sure you discuss any questions you have with your health care provider. Document Revised: 04/13/2020 Document Reviewed: 04/13/2020 Elsevier Patient Education  2022 [employer].

## 2021-11-22 NOTE — Progress Notes (Signed)
PRE-OPERATIVE HISTORY AND PHYSICAL EXAM  HPI:  Dana Shepherd is a 36 y.o. G1P1001 Patient's last menstrual period was 11/08/2021.; she is being admitted for surgery related to  growing dermoid cyst of right ovary .  Pt has had symptoms of none.  Denies pain other than occasional generalized discomfort.  Periods regular.  Does not desire future fertility.  Prior CS 10 yrs ago.   Previous evaluation: Dermoid dx back in 2019, slow growth over these years as seen by serial Ultrasounds  PMHx: Past Medical History:  Diagnosis Date   Dermoid cyst of ovary 2019   Hypertension    Hypothyroidism (acquired)    Obesity    sleep apnea   Past Surgical History:  Procedure Laterality Date   CESAREAN SECTION  2012   TONSILLECTOMY     Family History  Problem Relation Age of Onset   Diabetes Mother        DM Type 2   Multiple myeloma Mother 50   Hypertension Father    Diabetes Sister        DM Type 2   Hypertension Sister    Social History   Tobacco Use   Smoking status: Never   Smokeless tobacco: Never  Vaping Use   Vaping Use: Never used  Substance Use Topics   Alcohol use: Yes    Comment: social   Drug use: Never    Current Outpatient Medications:    cholecalciferol (VITAMIN D3) 25 MCG (1000 UNIT) tablet, Take 1,000 Units by mouth daily., Disp: , Rfl:    fluticasone (FLONASE) 50 MCG/ACT nasal spray, Place 1 spray into both nostrils 2 (two) times daily., Disp: 18 mL, Rfl: 0   hydrochlorothiazide (HYDRODIURIL) 25 MG tablet, Take 1 tablet by mouth daily., Disp: , Rfl:    ibuprofen (ADVIL) 600 MG tablet, Take 1 tablet (600 mg total) by mouth every 6 (six) hours as needed., Disp: 30 tablet, Rfl: 0   metoprolol succinate (TOPROL-XL) 50 MG 24 hr tablet, Take 50 mg by mouth daily., Disp: , Rfl:    norethindrone (MICRONOR) 0.35 MG tablet, Take 1 tablet (0.35 mg total) by mouth daily., Disp: 84 tablet, Rfl: 3 Allergies: Hydrocodone  Review of Systems  Constitutional:  Negative for  chills, fever and malaise/fatigue.  HENT:  Negative for congestion, sinus pain and sore throat.   Eyes:  Negative for blurred vision and pain.  Respiratory:  Negative for cough and wheezing.   Cardiovascular:  Negative for chest pain and leg swelling.  Gastrointestinal:  Negative for abdominal pain, constipation, diarrhea, heartburn, nausea and vomiting.  Genitourinary:  Negative for dysuria, frequency, hematuria and urgency.  Musculoskeletal:  Negative for back pain, joint pain, myalgias and neck pain.  Skin:  Negative for itching and rash.  Neurological:  Negative for dizziness, tremors and weakness.  Endo/Heme/Allergies:  Does not bruise/bleed easily.  Psychiatric/Behavioral:  Negative for depression. The patient is not nervous/anxious and does not have insomnia.    Objective: BP 120/80    Ht _0  (1.702 m)    Wt 262 lb (118.8 kg)    LMP 11/08/2021    BMI 41.04 kg/m   Filed Weights   11/22/21 1507  Weight: 262 lb (118.8 kg)   Physical Exam Constitutional:      General: She is not in acute distress.    Appearance: She is well-developed.  HENT:     Head: Normocephalic and atraumatic. No laceration.     Right Ear: Hearing normal.  Left Ear: Hearing normal.     Mouth/Throat:     Pharynx: Uvula midline.  Eyes:     Pupils: Pupils are equal, round, and reactive to light.  Neck:     Thyroid: No thyromegaly.  Cardiovascular:     Rate and Rhythm: Normal rate and regular rhythm.     Heart sounds: No murmur heard.   No friction rub. No gallop.  Pulmonary:     Effort: Pulmonary effort is normal. No respiratory distress.     Breath sounds: Normal breath sounds. No wheezing.  Abdominal:     General: Bowel sounds are normal. There is no distension.     Palpations: Abdomen is soft.     Tenderness: There is no abdominal tenderness. There is no rebound.  Musculoskeletal:        General: Normal range of motion.     Cervical back: Normal range of motion and neck supple.   Neurological:     Mental Status: She is alert and oriented to person, place, and time.     Cranial Nerves: No cranial nerve deficit.  Skin:    General: Skin is warm and dry.  Psychiatric:        Judgment: Judgment normal.  Vitals reviewed.    Assessment: 1. Dermoid cyst of right ovary   Plan laparoscopy with right ovarian cystectomy and possible oophorectomy  I have had a careful discussion with this patient about all the options available and the risk/benefits of each. I have fully informed this patient that surgery may subject her to a variety of discomforts and risks: She understands that most patients have surgery with little difficulty, but problems can happen ranging from minor to fatal. These include nausea, vomiting, pain, bleeding, infection, poor healing, hernia, or formation of adhesions. Unexpected reactions may occur from any drug or anesthetic given. Unintended injury may occur to other pelvic or abdominal structures such as Fallopian tubes, ovaries, bladder, ureter (tube from kidney to bladder), or bowel. Nerves going from the pelvis to the legs may be injured. Any such injury may require immediate or later additional surgery to correct the problem. Excessive blood loss requiring transfusion is very unlikely but possible. Dangerous blood clots may form in the legs or lungs. Physical and sexual activity will be restricted in varying degrees for an indeterminate period of time but most often 2-6 weeks.  Finally, she understands that it is impossible to list every possible undesirable effect and that the condition for which surgery is done is not always cured or significantly improved, and in rare cases may be even worse.Ample time was given to answer all questions.  Barnett Applebaum, MD, [facility], [facility] 11/22/2021  3:45 PM

## 2021-11-22 NOTE — Progress Notes (Signed)
HPI: Patient is a 36 y.o. G1P1001 who LMP was Patient's last menstrual period was 11/08/2021., presents today for a problem visit.  She complains of recent findings of Right ovarion cyst by Ultrasound - Pelvic Vaginal.  Pt has had symptoms of none.  Denies pain other than occasional generalized discomfort.  Periods regular.  Does not desire future fertility.  Prior CS 10 yrs ago.  Previous evaluation: Dermoid dx back in 2019, slow growth over these years as seen by serial Ultrasounds.  PMHx: She  has a past medical history of Dermoid cyst of ovary (2019), Hypertension, Hypothyroidism (acquired), and Obesity. Also,  has a past surgical history that includes Cesarean section (2012) and Tonsillectomy., family history includes Diabetes in her mother and sister; Hypertension in her father and sister; Multiple myeloma (age of onset: 13) in her mother.,  reports that she has never smoked. She has never used smokeless tobacco. She reports current alcohol use. She reports that she does not use drugs.  She has a current medication list which includes the following prescription(s): cholecalciferol, fluticasone, hydrochlorothiazide, ibuprofen, metoprolol succinate, norethindrone, and [DISCONTINUED] prochlorperazine. Also, is allergic to hydrocodone.  Review of Systems  Constitutional:  Negative for chills, fever and malaise/fatigue.  HENT:  Negative for congestion, sinus pain and sore throat.   Eyes:  Negative for blurred vision and pain.  Respiratory:  Negative for cough and wheezing.   Cardiovascular:  Negative for chest pain and leg swelling.  Gastrointestinal:  Negative for abdominal pain, constipation, diarrhea, heartburn, nausea and vomiting.  Genitourinary:  Negative for dysuria, frequency, hematuria and urgency.  Musculoskeletal:  Negative for back pain, joint pain, myalgias and neck pain.  Skin:  Negative for itching and rash.  Neurological:  Negative for dizziness, tremors and weakness.   Endo/Heme/Allergies:  Does not bruise/bleed easily.  Psychiatric/Behavioral:  Negative for depression. The patient is not nervous/anxious and does not have insomnia.    Objective: BP 120/80    Ht _0  (1.702 m)    Wt 262 lb (118.8 kg)    LMP 11/08/2021    BMI 41.04 kg/m  Physical Exam Constitutional:      General: She is not in acute distress.    Appearance: She is well-developed.  Musculoskeletal:        General: Normal range of motion.  Neurological:     Mental Status: She is alert and oriented to person, place, and time.  Skin:    General: Skin is warm and dry.  Vitals reviewed.    ASSESSMENT/PLAN:    Problem List Items Addressed This Visit       Endocrine   Dermoid cyst of right ovary - Primary  Options discussed, desires surgery as risks of pain and torsion and because it has not declined in size over the years, now >6 cm w higher risks.  Plan laparoscopy and cystectomy, possible oophorectomy  Barnett Applebaum, MD, [facility], [facility] 11/22/2021  3:40 PM

## 2021-11-24 ENCOUNTER — Other Ambulatory Visit: Payer: Self-pay | Admitting: Obstetrics & Gynecology

## 2021-11-24 ENCOUNTER — Telehealth: Payer: Self-pay | Admitting: Obstetrics & Gynecology

## 2021-11-24 DIAGNOSIS — D270 Benign neoplasm of right ovary: Secondary | ICD-10-CM

## 2021-11-24 NOTE — Telephone Encounter (Signed)
-----   Message from Gae Dry, MD sent at 11/22/2021  3:34 PM EST ----- Regarding: Surgery Preop Today  Surgery Booking Request Patient Full Name:  Anneta Rounds  MRN: [id number]  DOB: 01-26-1986  Surgeon: Hoyt Koch, MD  Requested Surgery Date and Time: Jan Primary Diagnosis AND Code: Dermoid cyst Secondary Diagnosis and Code: D27.0 Surgical Procedure: Laparoscopy with Ovarian Cystectomy possible Oophorectomy RNFA Requested?: Yes if no partner available L&D Notification: No Admission Status: same day surgery Length of Surgery: 50 min Special Case Needs: No H&P: No Phone Interview???:  Yes Interpreter: No Medical Clearance:  No Special Scheduling Instructions: No Any known health/anesthesia issues, diabetes, sleep apnea, latex allergy, defibrillator/pacemaker?: No Acuity: P3   (P1 highest, P2 delay may cause harm, P3 low, elective gyn, P4 lowest) Post op follow up visits: yes 2 wks

## 2021-11-24 NOTE — Telephone Encounter (Signed)
Patient is scheduled for OR on 01/03/22 (January dates offered but this date was better for patient's schedule), Pre-admit Testing phone interview to be scheduled (patient will watch for MyChart notification), and 2 week Post Op on 3/8 @ 3:55pm. Patient confirmed [employer].

## 2021-12-24 ENCOUNTER — Other Ambulatory Visit
Admission: RE | Admit: 2021-12-24 | Discharge: 2021-12-24 | Disposition: A | Discharge status: Home / Self Care | Payer: BC Managed Care – PPO | Source: Ambulatory Visit | Attending: Obstetrics & Gynecology | Admitting: Obstetrics & Gynecology

## 2021-12-24 ENCOUNTER — Other Ambulatory Visit: Payer: Self-pay

## 2021-12-24 HISTORY — DX: Other complications of anesthesia, initial encounter: T88.59XA

## 2021-12-24 HISTORY — DX: Cardiac arrhythmia, unspecified: I49.9

## 2021-12-24 NOTE — Patient Instructions (Addendum)
Your procedure is scheduled on: 01/03/22 - Thursday Report to the Registration Desk on the 1st floor of the [facility]. To find out your arrival time, please call [phone] between 1PM - 3PM on: 01/02/22 - Wednesday  REMEMBER: Instructions that are not followed completely may result in serious medical risk, up to and including death; or upon the discretion of your surgeon and anesthesiologist your surgery may need to be rescheduled.  Do not eat food or drink any fluids after midnight the night before surgery.  No gum chewing, lozengers or hard candies.  TAKE THESE MEDICATIONS THE MORNING OF SURGERY WITH A SIP OF WATER:  - metoprolol succinate (TOPROL-XL) 50 MG 24 hr tablet - norethindrone (MICRONOR) 0.35 MG tablet  One week prior to surgery: Stop Anti-inflammatories (NSAIDS) such as Advil, Aleve, Ibuprofen, Motrin, Naproxen, Naprosyn and Aspirin based products such as Excedrin, Goodys Powder, BC Powder.  Stop ANY OVER THE COUNTER supplements until after surgery.  You may however, continue to take Tylenol if needed for pain up until the day of surgery.  No Alcohol for 24 hours before or after surgery.  No Smoking including e-cigarettes for 24 hours prior to surgery.  No chewable tobacco products for at least 6 hours prior to surgery.  No nicotine patches on the day of surgery.  Do not use any "recreational" drugs for at least a week prior to your surgery.  Please be advised that the combination of cocaine and anesthesia may have negative outcomes, up to and including death. If you test positive for cocaine, your surgery will be cancelled.  On the morning of surgery brush your teeth with toothpaste and water, you may rinse your mouth with mouthwash if you wish. Do not swallow any toothpaste or mouthwash.  Use CHG Soap or wipes as directed on instruction sheet.  Do not wear jewelry, make-up, hairpins, clips or nail polish.  Do not wear lotions, powders, or perfumes.   Do  not shave body from the neck down 48 hours prior to surgery just in case you cut yourself which could leave a site for infection.  Also, freshly shaved skin may become irritated if using the CHG soap.  Contact lenses, hearing aids and dentures may not be worn into surgery.  Do not bring valuables to the hospital. [facility] is not responsible for any missing/lost belongings or valuables.   Notify your doctor if there is any change in your medical condition (cold, fever, infection).  Wear comfortable clothing (specific to your surgery type) to the hospital.  After surgery, you can help prevent lung complications by doing breathing exercises.  Take deep breaths and cough every 1-2 hours. Your doctor may order a device called an Incentive Spirometer to help you take deep breaths. When coughing or sneezing, hold a pillow firmly against your incision with both hands. This is called splinting. Doing this helps protect your incision. It also decreases belly discomfort.  If you are being admitted to the hospital overnight, leave your suitcase in the car. After surgery it may be brought to your room.  If you are being discharged the day of surgery, you will not be allowed to drive home. You will need a responsible adult (18 years or older) to drive you home and stay with you that night.   If you are taking public transportation, you will need to have a responsible adult (18 years or older) with you. Please confirm with your physician that it is acceptable to use public transportation.  Please call the [facility] at [phone] if you have any questions about these instructions.  Surgery Visitation Policy:  Patients undergoing a surgery or procedure may have one family member or support person with them as long as that person is not COVID-19 positive or experiencing its symptoms.  That person may remain in the waiting area during the procedure and may rotate out with other  people.  Inpatient Visitation:    Visiting hours are 7 a.m. to 8 p.m. Up to two visitors ages 16+ are allowed at one time in a patient room. The visitors may rotate out with other people during the day. Visitors must check out when they leave, or other visitors will not be allowed. One designated support person may remain overnight. The visitor must pass COVID-19 screenings, use hand sanitizer when entering and exiting the patients room and wear a mask at all times, including in the patients room. Patients must also wear a mask when staff or their visitor are in the room. Masking is required regardless of vaccination status.

## 2021-12-26 ENCOUNTER — Other Ambulatory Visit
Admission: RE | Admit: 2021-12-26 | Discharge: 2021-12-26 | Disposition: A | Discharge status: Home / Self Care | Payer: BC Managed Care – PPO | Source: Ambulatory Visit | Attending: Obstetrics & Gynecology | Admitting: Obstetrics & Gynecology

## 2021-12-26 ENCOUNTER — Other Ambulatory Visit: Payer: Self-pay

## 2021-12-26 ENCOUNTER — Encounter: Payer: Self-pay | Admitting: Urgent Care

## 2021-12-26 DIAGNOSIS — D270 Benign neoplasm of right ovary: Secondary | ICD-10-CM | POA: Diagnosis present

## 2021-12-26 LAB — CBC
HCT: 43 % (ref 36.0–46.0)
Hemoglobin: 14.8 g/dL (ref 12.0–15.0)
MCH: 29.1 pg (ref 26.0–34.0)
MCHC: 34.4 g/dL (ref 30.0–36.0)
MCV: 84.6 fL (ref 80.0–100.0)
Platelets: 390 10*3/uL (ref 150–400)
RBC: 5.08 MIL/uL (ref 3.87–5.11)
RDW: 12.3 % (ref 11.5–15.5)
WBC: 10.6 10*3/uL — ABNORMAL HIGH (ref 4.0–10.5)
nRBC: 0 % (ref 0.0–0.2)

## 2021-12-26 LAB — TYPE AND SCREEN
ABO/RH(D): O POS
Antibody Screen: NEGATIVE

## 2022-01-02 MED ORDER — CHLORHEXIDINE GLUCONATE 0.12 % MT SOLN: 15.0000 mL | Freq: Once | OROMUCOSAL | Status: AC

## 2022-01-02 MED ORDER — LACTATED RINGERS IV SOLN: INTRAVENOUS | Status: DC

## 2022-01-02 MED ORDER — ORAL CARE MOUTH RINSE: 15.0000 mL | Freq: Once | OROMUCOSAL | Status: AC

## 2022-01-02 MED ORDER — POVIDONE-IODINE 10 % EX SWAB: 2.0000 "application " | Freq: Once | CUTANEOUS | Status: DC

## 2022-01-02 MED ORDER — FAMOTIDINE 20 MG PO TABS: 20.0000 mg | ORAL_TABLET | Freq: Once | ORAL | Status: AC

## 2022-01-03 ENCOUNTER — Other Ambulatory Visit: Payer: Self-pay

## 2022-01-03 ENCOUNTER — Ambulatory Visit
Admission: RE | Admit: 2022-01-03 | Discharge: 2022-01-03 | Disposition: A | Discharge status: Home / Self Care | Payer: BC Managed Care – PPO | Attending: Obstetrics & Gynecology | Admitting: Obstetrics & Gynecology

## 2022-01-03 ENCOUNTER — Ambulatory Visit: Payer: BC Managed Care – PPO | Admitting: Registered Nurse

## 2022-01-03 ENCOUNTER — Encounter
Admission: RE | Disposition: A | Discharge status: Home / Self Care | Payer: Self-pay | Source: Home / Self Care | Attending: Obstetrics & Gynecology

## 2022-01-03 DIAGNOSIS — D270 Benign neoplasm of right ovary: Secondary | ICD-10-CM | POA: Diagnosis not present

## 2022-01-03 DIAGNOSIS — Z6841 Body Mass Index (BMI) 40.0 and over, adult: Secondary | ICD-10-CM | POA: Insufficient documentation

## 2022-01-03 DIAGNOSIS — G4730 Sleep apnea, unspecified: Secondary | ICD-10-CM | POA: Diagnosis not present

## 2022-01-03 DIAGNOSIS — I10 Essential (primary) hypertension: Secondary | ICD-10-CM | POA: Insufficient documentation

## 2022-01-03 DIAGNOSIS — Z79899 Other long term (current) drug therapy: Secondary | ICD-10-CM | POA: Diagnosis not present

## 2022-01-03 DIAGNOSIS — E669 Obesity, unspecified: Secondary | ICD-10-CM | POA: Insufficient documentation

## 2022-01-03 HISTORY — PX: LAPAROSCOPIC LYSIS OF ADHESIONS: SHX5905

## 2022-01-03 LAB — ABO/RH: ABO/RH(D): O POS

## 2022-01-03 LAB — POCT PREGNANCY, URINE: Preg Test, Ur: NEGATIVE

## 2022-01-03 SURGERY — OOPHORECTOMY, LAPAROSCOPIC
Anesthesia: General | Laterality: Right

## 2022-01-03 MED ORDER — SUGAMMADEX SODIUM 200 MG/2ML IV SOLN
INTRAVENOUS | Status: DC | PRN
Start: 2022-01-03 — End: 2022-01-03
  Administered 2022-01-03: 250 mg via INTRAVENOUS

## 2022-01-03 MED ORDER — ROCURONIUM BROMIDE 100 MG/10ML IV SOLN
INTRAVENOUS | Status: DC | PRN
  Administered 2022-01-03: 50 mg via INTRAVENOUS

## 2022-01-03 MED ORDER — MORPHINE SULFATE (PF) 2 MG/ML IV SOLN: 1.0000 mg | INTRAVENOUS | Status: DC | PRN

## 2022-01-03 MED ORDER — ONDANSETRON HCL 4 MG/2ML IJ SOLN
INTRAMUSCULAR | Status: AC
Start: 2022-01-03 — End: 2022-01-03
  Administered 2022-01-03: 4 mg via INTRAVENOUS
  Filled 2022-01-03: qty 2

## 2022-01-03 MED ORDER — FENTANYL CITRATE (PF) 100 MCG/2ML IJ SOLN: 25.0000 ug | INTRAMUSCULAR | Status: DC | PRN

## 2022-01-03 MED ORDER — BUPIVACAINE HCL (PF) 0.5 % IJ SOLN
INTRAMUSCULAR | Status: DC | PRN
  Administered 2022-01-03: 12 mL

## 2022-01-03 MED ORDER — PROPOFOL 10 MG/ML IV BOLUS
INTRAVENOUS | Status: DC | PRN
  Administered 2022-01-03: 200 mg via INTRAVENOUS

## 2022-01-03 MED ORDER — ONDANSETRON HCL 4 MG/2ML IJ SOLN: 4.0000 mg | Freq: Once | INTRAMUSCULAR | Status: AC | PRN

## 2022-01-03 MED ORDER — KETOROLAC TROMETHAMINE 30 MG/ML IJ SOLN
INTRAMUSCULAR | Status: DC | PRN
  Administered 2022-01-03: 30 mg via INTRAVENOUS

## 2022-01-03 MED ORDER — LIDOCAINE HCL (CARDIAC) PF 100 MG/5ML IV SOSY
PREFILLED_SYRINGE | INTRAVENOUS | Status: DC | PRN
Start: 2022-01-03 — End: 2022-01-03
  Administered 2022-01-03: 50 mg via INTRAVENOUS

## 2022-01-03 MED ORDER — FAMOTIDINE 20 MG PO TABS
ORAL_TABLET | ORAL | Status: AC
  Administered 2022-01-03: 20 mg via ORAL
  Filled 2022-01-03: qty 1

## 2022-01-03 MED ORDER — LACTATED RINGERS IV SOLN: INTRAVENOUS | Status: DC

## 2022-01-03 MED ORDER — DEXAMETHASONE SODIUM PHOSPHATE 10 MG/ML IJ SOLN
INTRAMUSCULAR | Status: DC | PRN
  Administered 2022-01-03: 10 mg via INTRAVENOUS

## 2022-01-03 MED ORDER — ACETAMINOPHEN 10 MG/ML IV SOLN
INTRAVENOUS | Status: AC
  Filled 2022-01-03: qty 100

## 2022-01-03 MED ORDER — MIDAZOLAM HCL 2 MG/2ML IJ SOLN
INTRAMUSCULAR | Status: DC | PRN
  Administered 2022-01-03: 2 mg via INTRAVENOUS

## 2022-01-03 MED ORDER — PROMETHAZINE HCL 25 MG/ML IJ SOLN: 6.2500 mg | Freq: Once | INTRAMUSCULAR | Status: AC

## 2022-01-03 MED ORDER — ACETAMINOPHEN 325 MG PO TABS: 650.0000 mg | ORAL_TABLET | ORAL | Status: DC | PRN

## 2022-01-03 MED ORDER — OXYCODONE-ACETAMINOPHEN 5-325 MG PO TABS: 1.0000 | ORAL_TABLET | ORAL | Status: DC | PRN

## 2022-01-03 MED ORDER — BUPIVACAINE HCL (PF) 0.5 % IJ SOLN
INTRAMUSCULAR | Status: AC
  Filled 2022-01-03: qty 30

## 2022-01-03 MED ORDER — FENTANYL CITRATE (PF) 100 MCG/2ML IJ SOLN
INTRAMUSCULAR | Status: AC
  Filled 2022-01-03: qty 2

## 2022-01-03 MED ORDER — PROPOFOL 10 MG/ML IV BOLUS
INTRAVENOUS | Status: AC
  Filled 2022-01-03: qty 20

## 2022-01-03 MED ORDER — ACETAMINOPHEN 10 MG/ML IV SOLN
INTRAVENOUS | Status: DC | PRN
  Administered 2022-01-03: 1000 mg via INTRAVENOUS

## 2022-01-03 MED ORDER — MIDAZOLAM HCL 2 MG/2ML IJ SOLN
INTRAMUSCULAR | Status: AC
  Filled 2022-01-03: qty 2

## 2022-01-03 MED ORDER — DEXMEDETOMIDINE (PRECEDEX) IN NS 20 MCG/5ML (4 MCG/ML) IV SYRINGE
PREFILLED_SYRINGE | INTRAVENOUS | Status: AC
  Filled 2022-01-03: qty 5

## 2022-01-03 MED ORDER — ONDANSETRON HCL 4 MG/2ML IJ SOLN
INTRAMUSCULAR | Status: DC | PRN
  Administered 2022-01-03: 4 mg via INTRAVENOUS

## 2022-01-03 MED ORDER — CHLORHEXIDINE GLUCONATE 0.12 % MT SOLN
OROMUCOSAL | Status: AC
Start: 2022-01-03 — End: 2022-01-03
  Administered 2022-01-03: 15 mL via OROMUCOSAL
  Filled 2022-01-03: qty 15

## 2022-01-03 MED ORDER — OXYCODONE-ACETAMINOPHEN 5-325 MG PO TABS: 1.0000 | ORAL_TABLET | ORAL | 0 refills | Status: DC | PRN

## 2022-01-03 MED ORDER — ROCURONIUM BROMIDE 10 MG/ML (PF) SYRINGE
PREFILLED_SYRINGE | INTRAVENOUS | Status: AC
  Filled 2022-01-03: qty 10

## 2022-01-03 MED ORDER — SUGAMMADEX SODIUM 500 MG/5ML IV SOLN
INTRAVENOUS | Status: AC
  Filled 2022-01-03: qty 5

## 2022-01-03 MED ORDER — FENTANYL CITRATE (PF) 100 MCG/2ML IJ SOLN
INTRAMUSCULAR | Status: DC | PRN
  Administered 2022-01-03: 50 ug via INTRAVENOUS
  Administered 2022-01-03 (×2): 25 ug via INTRAVENOUS
  Administered 2022-01-03: 50 ug via INTRAVENOUS
  Administered 2022-01-03 (×2): 25 ug via INTRAVENOUS

## 2022-01-03 MED ORDER — ACETAMINOPHEN 650 MG RE SUPP
650.0000 mg | RECTAL | Status: DC | PRN
  Filled 2022-01-03: qty 1

## 2022-01-03 MED ORDER — PROMETHAZINE HCL 25 MG/ML IJ SOLN
INTRAMUSCULAR | Status: AC
  Administered 2022-01-03: 6.25 mg via INTRAVENOUS
  Filled 2022-01-03: qty 1

## 2022-01-03 MED ORDER — ONDANSETRON HCL 4 MG/2ML IJ SOLN
INTRAMUSCULAR | Status: AC
  Filled 2022-01-03: qty 2

## 2022-01-03 MED ORDER — DEXMEDETOMIDINE (PRECEDEX) IN NS 20 MCG/5ML (4 MCG/ML) IV SYRINGE
PREFILLED_SYRINGE | INTRAVENOUS | Status: DC | PRN
  Administered 2022-01-03 (×2): 10 ug via INTRAVENOUS

## 2022-01-03 MED ORDER — DEXAMETHASONE SODIUM PHOSPHATE 10 MG/ML IJ SOLN
INTRAMUSCULAR | Status: AC
  Filled 2022-01-03: qty 1

## 2022-01-03 MED ORDER — KETOROLAC TROMETHAMINE 30 MG/ML IJ SOLN
INTRAMUSCULAR | Status: AC
  Filled 2022-01-03: qty 1

## 2022-01-03 SURGICAL SUPPLY — 50 items
ADH SKN CLS APL DERMABOND .7 (GAUZE/BANDAGES/DRESSINGS) ×2
ANCHOR TIS RET SYS 235ML (MISCELLANEOUS) ×1 IMPLANT
APL PRP STRL LF DISP 70% ISPRP (MISCELLANEOUS) ×2
BACTOSHIELD CHG 4% 4OZ (MISCELLANEOUS) ×1
BAG SPEC RTRVL LRG 6X4 10 (ENDOMECHANICALS)
BAG TISS RTRVL C235 10X14 (MISCELLANEOUS) ×2
BLADE SURG SZ11 CARB STEEL (BLADE) ×4 IMPLANT
CATH ROBINSON RED A/P 16FR (CATHETERS) ×4 IMPLANT
CHLORAPREP W/TINT 26 (MISCELLANEOUS) ×4 IMPLANT
DERMABOND ADVANCED (GAUZE/BANDAGES/DRESSINGS) ×1
DERMABOND ADVANCED .7 DNX12 (GAUZE/BANDAGES/DRESSINGS) ×3 IMPLANT
DRSG TELFA 4X3 1S NADH ST (GAUZE/BANDAGES/DRESSINGS) IMPLANT
GAUZE 4X4 16PLY ~~LOC~~+RFID DBL (SPONGE) ×8 IMPLANT
GLOVE SURG ENC MOIS LTX SZ8 (GLOVE) ×8 IMPLANT
GLOVE SURG UNDER LTX SZ8 (GLOVE) ×11 IMPLANT
GOWN STRL REUS W/ TWL LRG LVL3 (GOWN DISPOSABLE) ×3 IMPLANT
GOWN STRL REUS W/ TWL XL LVL3 (GOWN DISPOSABLE) ×3 IMPLANT
GOWN STRL REUS W/TWL LRG LVL3 (GOWN DISPOSABLE) ×3
GOWN STRL REUS W/TWL XL LVL3 (GOWN DISPOSABLE) ×3
GRASPER SUT TROCAR 14GX15 (MISCELLANEOUS) ×4 IMPLANT
IRRIGATION STRYKERFLOW (MISCELLANEOUS) IMPLANT
IRRIGATOR STRYKERFLOW (MISCELLANEOUS) ×6
IV LACTATED RINGERS 1000ML (IV SOLUTION) ×2 IMPLANT
KIT PINK PAD W/HEAD ARE REST (MISCELLANEOUS) ×3
KIT PINK PAD W/HEAD ARM REST (MISCELLANEOUS) ×3 IMPLANT
LABEL OR SOLS (LABEL) ×4 IMPLANT
MANIFOLD NEPTUNE II (INSTRUMENTS) ×4 IMPLANT
NEEDLE VERESS 14GA 120MM (NEEDLE) ×4 IMPLANT
NS IRRIG 500ML POUR BTL (IV SOLUTION) ×4 IMPLANT
PACK GYN LAPAROSCOPIC (MISCELLANEOUS) ×4 IMPLANT
PAD PREP 24X41 OB/GYN DISP (PERSONAL CARE ITEMS) ×4 IMPLANT
POUCH SPECIMEN RETRIEVAL 10MM (ENDOMECHANICALS) IMPLANT
SCISSORS METZENBAUM CVD 33 (INSTRUMENTS) ×3 IMPLANT
SCRUB CHG 4% DYNA-HEX 4OZ (MISCELLANEOUS) ×3 IMPLANT
SET TUBE SMOKE EVAC HIGH FLOW (TUBING) ×4 IMPLANT
SHEARS HARMONIC ACE PLUS 36CM (ENDOMECHANICALS) ×1 IMPLANT
SLEEVE ENDOPATH XCEL 5M (ENDOMECHANICALS) ×1 IMPLANT
SOL PREP PVP 2OZ (MISCELLANEOUS) ×3
SOLUTION PREP PVP 2OZ (MISCELLANEOUS) ×3 IMPLANT
SPONGE GAUZE 2X2 8PLY STRL LF (GAUZE/BANDAGES/DRESSINGS) IMPLANT
STRAP SAFETY 5IN WIDE (MISCELLANEOUS) ×4 IMPLANT
SUT VIC AB 0 CT1 36 (SUTURE) ×4 IMPLANT
SUT VIC AB 2-0 UR6 27 (SUTURE) ×2 IMPLANT
SUT VIC AB 4-0 PS2 18 (SUTURE) IMPLANT
SYR 10ML LL (SYRINGE) ×4 IMPLANT
SYSTEM WECK SHIELD CLOSURE (TROCAR) IMPLANT
TROCAR 5M 150ML BLDLS (TROCAR) ×1 IMPLANT
TROCAR ENDO BLADELESS 11MM (ENDOMECHANICALS) ×1 IMPLANT
TROCAR XCEL NON-BLD 5MMX100MML (ENDOMECHANICALS) ×4 IMPLANT
WATER STERILE IRR 500ML POUR (IV SOLUTION) ×4 IMPLANT

## 2022-01-03 NOTE — Transfer of Care (Signed)
Immediate Anesthesia Transfer of Care Note  Patient: [facility]  Procedure(s) Performed: LAPAROSCOPIC OOPHORECTOMY (Right) LAPAROSCOPIC LYSIS OF ADHESIONS  Patient Location: PACU  Anesthesia Type:General  Level of Consciousness: awake and drowsy  Airway & Oxygen Therapy: Patient Spontanous Breathing  Post-op Assessment: Report given to RN and Post -op Vital signs reviewed and stable  Post vital signs: Reviewed and stable  Last Vitals:  Vitals Value Taken Time  BP 108/71 01/03/22 1252  Temp    Pulse 55 01/03/22 1257  Resp 16 01/03/22 1257  SpO2 98 % 01/03/22 1257  Vitals shown include unvalidated device data.  Last Pain:  Vitals:   01/03/22 1112  TempSrc: Oral  PainSc: 0-No pain         Complications: No notable events documented.

## 2022-01-03 NOTE — Anesthesia Postprocedure Evaluation (Signed)
Anesthesia Post Note  Patient: [employer]  Procedure(s) Performed: LAPAROSCOPIC OOPHORECTOMY (Right) LAPAROSCOPIC LYSIS OF ADHESIONS  Patient location during evaluation: PACU Anesthesia Type: General Level of consciousness: awake and awake and alert Pain management: pain level controlled Vital Signs Assessment: post-procedure vital signs reviewed and stable Respiratory status: spontaneous breathing and respiratory function stable Cardiovascular status: blood pressure returned to baseline Anesthetic complications: no   No notable events documented.   Last Vitals:  Vitals:   01/03/22 1257 01/03/22 1300  BP:  96/76  Pulse: (!) 58 62  Resp: 17 15  Temp:    SpO2: 98% 98%    Last Pain:  Vitals:   01/03/22 1252  TempSrc:   PainSc: Asleep                 VAN STAVEREN,Shayli Altemose

## 2022-01-03 NOTE — H&P (Signed)
PRE-OPERATIVE HISTORY AND PHYSICAL EXAM   HPI:  Dana [facility] is a 36 y.o. G1P1001 being admitted for surgery related to  growing dermoid cyst of right ovary .  Pt has had symptoms of - none.  Denies pain other than occasional generalized discomfort.  Periods regular.  Does not desire future fertility.  Prior CS 10 yrs ago.   Previous evaluation: Dermoid dx back in 2019, slow growth over these years as seen by serial Ultrasounds.  Right ovary by recent US: Measurements: 6.7 cm x 5.7 cm x 5.7 cm = volume: 113 mL. A stable appearing mixed hyperechoic and hypoechoic lesion is seen occupying the majority of the right ovary with poor visualization of normal right ovarian parenchyma.   PMHx:     Past Medical History:  Diagnosis Date   Dermoid cyst of ovary 2019   Hypertension     Hypothyroidism (acquired)     Obesity      sleep apnea         Past Surgical History:  Procedure Laterality Date   CESAREAN SECTION   2012   TONSILLECTOMY             Family History  Problem Relation Age of Onset   Diabetes Mother          DM Type 2   Multiple myeloma Mother 57   Hypertension Father     Diabetes Sister          DM Type 2   Hypertension Sister      Social History         Tobacco Use   Smoking status: Never   Smokeless tobacco: Never  Vaping Use   Vaping Use: Never used  Substance Use Topics   Alcohol use: Yes      Comment: social   Drug use: Never      Current Outpatient Medications:    cholecalciferol (VITAMIN D3) 25 MCG (1000 UNIT) tablet, Take 1,000 Units by mouth daily., Disp: , Rfl:    fluticasone (FLONASE) 50 MCG/ACT nasal spray, Place 1 spray into both nostrils 2 (two) times daily., Disp: 18 mL, Rfl: 0   hydrochlorothiazide (HYDRODIURIL) 25 MG tablet, Take 1 tablet by mouth daily., Disp: , Rfl:    ibuprofen (ADVIL) 600 MG tablet, Take 1 tablet (600 mg total) by mouth every 6 (six) hours as needed., Disp: 30 tablet, Rfl: 0    metoprolol succinate (TOPROL-XL) 50 MG 24 hr tablet, Take 50 mg by mouth daily., Disp: , Rfl:    norethindrone (MICRONOR) 0.35 MG tablet, Take 1 tablet (0.35 mg total) by mouth daily., Disp: 84 tablet, Rfl: 3 Allergies: Hydrocodone   Review of Systems  Constitutional:  Negative for chills, fever and malaise/fatigue.  HENT:  Negative for congestion, sinus pain and sore throat.   Eyes:  Negative for blurred vision and pain.  Respiratory:  Negative for cough and wheezing.   Cardiovascular:  Negative for chest pain and leg swelling.  Gastrointestinal:  Negative for abdominal pain, constipation, diarrhea, heartburn, nausea and vomiting.  Genitourinary:  Negative for dysuria, frequency, hematuria and urgency.  Musculoskeletal:  Negative for back pain, joint pain, myalgias and neck pain.  Skin:  Negative for itching and rash.  Neurological:  Negative for dizziness, tremors and weakness.  Endo/Heme/Allergies:  Does not  bruise/bleed easily.  Psychiatric/Behavioral:  Negative for depression. The patient is not nervous/anxious and does not have insomnia.     Objective: Physical Exam Constitutional:      General: She is not in acute distress.    Appearance: She is well-developed.  HENT:     Head: Normocephalic and atraumatic. No laceration.     Right Ear: Hearing normal.     Left Ear: Hearing normal.     Mouth/Throat:     Pharynx: Uvula midline.  Eyes:     Pupils: Pupils are equal, round, and reactive to light.  Neck:     Thyroid: No thyromegaly.  Cardiovascular:     Rate and Rhythm: Normal rate and regular rhythm.     Heart sounds: No murmur heard.   No friction rub. No gallop.  Pulmonary:     Effort: Pulmonary effort is normal. No respiratory distress.     Breath sounds: Normal breath sounds. No wheezing.  Abdominal:     General: Bowel sounds are normal. There is no distension.     Palpations: Abdomen is soft.     Tenderness: There is no abdominal tenderness. There is no rebound.   Musculoskeletal:        General: Normal range of motion.     Cervical back: Normal range of motion and neck supple.  Neurological:     Mental Status: She is alert and oriented to person, place, and time.     Cranial Nerves: No cranial nerve deficit.  Skin:    General: Skin is warm and dry.  Psychiatric:        Judgment: Judgment normal.  Vitals reviewed.      Assessment: 1. Dermoid cyst of right ovary   Plan laparoscopy with right ovarian cystectomy and possible oophorectomy   I have had a careful discussion with this patient about all the options available and the risk/benefits of each. I have fully informed this patient that surgery may subject her to a variety of discomforts and risks: She understands that most patients have surgery with little difficulty, but problems can happen ranging from minor to fatal. These include nausea, vomiting, pain, bleeding, infection, poor healing, hernia, or formation of adhesions. Unexpected reactions may occur from any drug or anesthetic given. Unintended injury may occur to other pelvic or abdominal structures such as Fallopian tubes, ovaries, bladder, ureter (tube from kidney to bladder), or bowel. Nerves going from the pelvis to the legs may be injured. Any such injury may require immediate or later additional surgery to correct the problem. Excessive blood loss requiring transfusion is very unlikely but possible. Dangerous blood clots may form in the legs or lungs. Physical and sexual activity will be restricted in varying degrees for an indeterminate period of time but most often 2-6 weeks.  Finally, she understands that it is impossible to list every possible undesirable effect and that the condition for which surgery is done is not always cured or significantly improved, and in rare cases may be even worse.Ample time was given to answer all questions.  Barnett Applebaum, MD, [facility], [facility] 01/03/2022  10:29 AM

## 2022-01-03 NOTE — Discharge Instructions (Signed)
AMBULATORY SURGERY  ?DISCHARGE INSTRUCTIONS ? ? ?The drugs that you were given will stay in your system until tomorrow so for the next 24 hours you should not: ? ?Drive an automobile ?Make any legal decisions ?Drink any alcoholic beverage ? ? ?You may resume regular meals tomorrow.  Today it is better to start with liquids and gradually work up to solid foods. ? ?You may eat anything you prefer, but it is better to start with liquids, then soup and crackers, and gradually work up to solid foods. ? ? ?Please notify your doctor immediately if you have any unusual bleeding, trouble breathing, redness and pain at the surgery site, drainage, fever, or pain not relieved by medication. ? ? ? ?Additional Instructions: ? ? ? ?Please contact your physician with any problems or Same Day Surgery at [phone], Monday through Friday 6 am to 4 pm, or [facility] [place] [facility] number at [phone].  ?

## 2022-01-03 NOTE — Op Note (Signed)
Operative Note:  PRE-OP DIAGNOSIS: Right Dermoid cyst D27.0   POST-OP DIAGNOSIS: Right Dermoid cyst D27.0   PROCEDURE: Procedure(s): LAPAROSCOPIC RIGHT OOPHORECTOMY LAPAROSCOPIC LYSIS OF ADHESIONS  SURGEON: Barnett Applebaum, MD, FACOG  ANESTHESIA: General endotracheal anesthesia  ESTIMATED BLOOD LOSS: Minimal  SPECIMENS: Right Ovary.  COMPLICATIONS: None  DISPOSITION: stable to PACU  FINDINGS: Intraabdominal adhesions were noted. These were mostly of omentum to anterior uterus and anterior abdominal wall Right ovary enlarged and completely involved with the dermoid cyst.  PROCEDURE IN DETAIL: The patient was taken to the OR where anesthesia was administed. The patient was positioned supine. Foley placed.  Attention was turned to the patients abdomen where a 5 mm skin incision was made in the umbilical fold, after injection of local anesthesia. The Veress step needle was carefully introduced into the peritoneal cavity with placement confirmed using the hanging drop technique.  Pneumoperitoneum was obtained. The 5 mm port was then placed under direct visualization with the operative laparoscope  The above noted findings.  Trendelenburg.  5 mm trocar was then placed in RLQ lateral to the inferior epigastric blood vessels under direct visualization with the laparoscope.  Also, a suprapubic 11 mm trocal and port was placed.  Right ovary are identified and the ovarian blood vessels/infundibulopelvic ligament pedicles are coagulated and ligated using the Harmonic scapel.  Dissection is carried out to the uterine-ovarian blood vessels which also are transected for amputation of only this ovary.  Removed through a specimen bag.  No injuries or bleeding was noted.  All instruments and ports were then removed from the abdomen after gas was expelled and patient was leveled.   Fascia at the larger port site is closed with 2 separate 2-0 vicryl figure of wight suture placements. The skin was closed at  this site with a 4-0 vicryl suture and at all sites then with skin adhesive. The foley catheter was removed. The patient tolerated the procedure well. All counts were correct x 2. The patient was transferred to the recovery room awake, alert and breathing independently.  Barnett Applebaum, MD, [facility], [facility] 01/03/2022  12:45 PM

## 2022-01-03 NOTE — Anesthesia Procedure Notes (Signed)
Procedure Name: Intubation Date/Time: 01/03/2022 11:32 AM Performed by: Johnna Acosta, CRNA Pre-anesthesia Checklist: Patient identified, Emergency Drugs available, Suction available, Patient being monitored and Timeout performed Patient Re-evaluated:Patient Re-evaluated prior to induction Oxygen Delivery Method: Circle system utilized Preoxygenation: Pre-oxygenation with 100% oxygen Induction Type: IV induction Ventilation: Mask ventilation without difficulty Laryngoscope Size: McGraph and 3 Grade View: Grade I Tube type: Oral Tube size: 7.0 mm Airway Equipment and Method: Stylet and Video-laryngoscopy Placement Confirmation: ETT inserted through vocal cords under direct vision, positive ETCO2 and breath sounds checked- equal and bilateral Secured at: 21 cm Tube secured with: Tape Dental Injury: Teeth and Oropharynx as per pre-operative assessment

## 2022-01-03 NOTE — Anesthesia Preprocedure Evaluation (Signed)
Anesthesia Evaluation  Patient identified by MRN, date of birth, ID band Patient awake    Reviewed: Allergy & Precautions, NPO status , Patient's Chart, lab work & pertinent test results  Airway Mallampati: II  TM Distance: >3 FB Neck ROM: full    Dental  (+) Teeth Intact   Pulmonary neg pulmonary ROS,    Pulmonary exam normal breath sounds clear to auscultation       Cardiovascular Exercise Tolerance: Good hypertension, Pt. on medications negative cardio ROS Normal cardiovascular exam Rhythm:Regular Rate:Normal     Neuro/Psych negative neurological ROS  negative psych ROS   GI/Hepatic negative GI ROS, Neg liver ROS,   Endo/Other  negative endocrine ROSHypothyroidism   Renal/GU   negative genitourinary   Musculoskeletal negative musculoskeletal ROS (+)   Abdominal Normal abdominal exam  (+)   Peds negative pediatric ROS (+)  Hematology negative hematology ROS (+)   Anesthesia Other Findings Past Medical History: No date: Complication of anesthesia     Comment:  epidural with c section- BP dropped low and had to have               an emergency c section 2019: Dermoid cyst of ovary No date: Dysrhythmia No date: Hypertension No date: Hypothyroidism (acquired) No date: Obesity     Comment:  sleep apnea  Past Surgical History: 2012: CESAREAN SECTION No date: TONSILLECTOMY     Reproductive/Obstetrics negative OB ROS                             Anesthesia Physical Anesthesia Plan  ASA: 2  Anesthesia Plan: General   Post-op Pain Management:    Induction:   PONV Risk Score and Plan: 2 and Ondansetron and Dexamethasone  Airway Management Planned: Oral ETT  Additional Equipment:   Intra-op Plan:   Post-operative Plan: Extubation in OR  Informed Consent: I have reviewed the patients History and Physical, chart, labs and discussed the procedure including the risks,  benefits and alternatives for the proposed anesthesia with the patient or authorized representative who has indicated his/her understanding and acceptance.     Dental Advisory Given  Plan Discussed with: CRNA and Surgeon  Anesthesia Plan Comments:         Anesthesia Quick Evaluation

## 2022-01-04 ENCOUNTER — Encounter: Payer: Self-pay | Admitting: Obstetrics & Gynecology

## 2022-01-04 LAB — SURGICAL PATHOLOGY

## 2022-01-07 ENCOUNTER — Encounter: Payer: Self-pay | Admitting: Obstetrics & Gynecology

## 2022-01-23 ENCOUNTER — Ambulatory Visit (INDEPENDENT_AMBULATORY_CARE_PROVIDER_SITE_OTHER): Payer: BC Managed Care – PPO | Admitting: Obstetrics & Gynecology

## 2022-01-23 ENCOUNTER — Encounter: Payer: BC Managed Care – PPO | Admitting: Obstetrics & Gynecology

## 2022-01-23 ENCOUNTER — Other Ambulatory Visit: Payer: Self-pay

## 2022-01-23 ENCOUNTER — Encounter: Payer: Self-pay | Admitting: Obstetrics & Gynecology

## 2022-01-23 VITALS — BP 130/80 | Ht 67.0 in | Wt 264.0 lb

## 2022-01-23 DIAGNOSIS — D270 Benign neoplasm of right ovary: Secondary | ICD-10-CM

## 2022-01-23 NOTE — Progress Notes (Signed)
?  Postoperative Follow-up ?Patient presents post op from laparoscopy and Left Oophorectomy  for  Dermoid Cyst , 3 weeks ago. ? ?Subjective: ?Patient reports marked improvement in her preop symptoms. Eating a regular diet without difficulty. The patient is not having any pain.  Activity: normal activities of daily living. Patient reports additional symptom's since surgery of suprapubic  pain with/at end of urination.  No burning or hematuria.   ? ?Path: ?DIAGNOSIS:  ?A. OVARY, RIGHT; OOPHORECTOMY:  ?- MATURE CYSTIC TERATOMA.  ?- NEGATIVE FOR MALIGNANCY.  ? ?Objective: ?BP 130/80   Ht '5\' 7"'$  (1.702 m)   Wt 264 lb (119.7 kg)   BMI 41.35 kg/m?  ?Physical Exam ?Constitutional:   ?   General: She is not in acute distress. ?   Appearance: She is well-developed.  ?Cardiovascular:  ?   Rate and Rhythm: Normal rate.  ?Pulmonary:  ?   Effort: Pulmonary effort is normal.  ?Abdominal:  ?   General: There is no distension.  ?   Palpations: Abdomen is soft.  ?   Tenderness: There is no abdominal tenderness.  ?   Comments: Incision Healing Well.  Small palpable NT mass beneath suprapubic incision. ?  ?Musculoskeletal:     ?   General: Normal range of motion.  ?Neurological:  ?   Mental Status: She is alert and oriented to person, place, and time.  ?   Cranial Nerves: No cranial nerve deficit.  ?Skin: ?   General: Skin is warm and dry.  ? ? ?UA NEG ? ?Assessment: ?s/p lap left oophorectomy:   progressing well ? ?Plan: ?Patient has done well after surgery with no apparent complications.  I have discussed the post-operative course to date, and the expected progress moving forward.  The patient understands what complications to be concerned about.  I will see the patient in routine follow up, or sooner if needed.   ? ?Scar tissue at incision, allow to heal ?Monitor pain w urination. Urology follow up if persists ?Activity plan: No restriction. ? ?Hoyt Koch ?01/23/2022, 3:46 PM ? ? ?

## 2022-06-04 ENCOUNTER — Ambulatory Visit (INDEPENDENT_AMBULATORY_CARE_PROVIDER_SITE_OTHER): Payer: BC Managed Care – PPO | Admitting: Obstetrics & Gynecology

## 2022-06-04 VITALS — BP 128/90 | Wt 269.8 lb

## 2022-06-04 DIAGNOSIS — L729 Follicular cyst of the skin and subcutaneous tissue, unspecified: Secondary | ICD-10-CM

## 2022-06-04 NOTE — Progress Notes (Signed)
   Subjective:    Patient ID: Dana Shepherd, female    DOB: May 09, 1986, 36 y.o.   MRN: [id number]  HPI 36 yo married P1 (13 yo son) here with a 1 week history of a bump on the skin of her left breast. She denies any family history of breast cancer, denies nipple discharge. She did not breast feed her son.   Review of Systems She uses extended cycle OCPs and has a period about q 3 months. Her pap was normal and is up to date.     Objective:   Physical Exam Well nourished, well hydrated White female, no apparent distress She is ambulating and conversing normally.  Her actual breast tissue is normal bilaterally, generally dense bilaterally, no nipple discharge or masses. She has a 1 cm skin cyst on the skin of the left breast at the 5 o'clock position near the limits of the breast.      Assessment & Plan:  Cystic lesion of skin- benign I rec'd warm compresses, hibiclens Reassurance given

## 2022-07-04 ENCOUNTER — Ambulatory Visit: Payer: BC Managed Care – PPO | Admitting: Obstetrics and Gynecology

## 2022-09-24 ENCOUNTER — Encounter: Payer: Self-pay | Admitting: Obstetrics and Gynecology

## 2022-09-24 DIAGNOSIS — R1032 Left lower quadrant pain: Secondary | ICD-10-CM

## 2022-09-25 ENCOUNTER — Ambulatory Visit (INDEPENDENT_AMBULATORY_CARE_PROVIDER_SITE_OTHER): Payer: BC Managed Care – PPO

## 2022-09-25 ENCOUNTER — Encounter: Payer: Self-pay | Admitting: Obstetrics and Gynecology

## 2022-09-25 DIAGNOSIS — R1032 Left lower quadrant pain: Secondary | ICD-10-CM

## 2022-10-18 ENCOUNTER — Other Ambulatory Visit: Payer: Self-pay | Admitting: Obstetrics and Gynecology

## 2022-10-18 DIAGNOSIS — Z3041 Encounter for surveillance of contraceptive pills: Secondary | ICD-10-CM

## 2022-10-18 HISTORY — PX: CARPAL TUNNEL RELEASE: SHX101

## 2022-12-13 ENCOUNTER — Encounter: Payer: Self-pay | Admitting: Obstetrics and Gynecology

## 2022-12-13 DIAGNOSIS — N83202 Unspecified ovarian cyst, left side: Secondary | ICD-10-CM

## 2022-12-13 DIAGNOSIS — R1032 Left lower quadrant pain: Secondary | ICD-10-CM

## 2022-12-16 NOTE — Progress Notes (Signed)
PCP:  Dana Paradise, MD   Chief Complaint  Patient presents with   Gynecologic Exam    Left side pelvic pain   Vaginal Discharge    Dark discharge, no vag itching or odor     HPI:      [name] is a 37 y.o. G1P1001 who LMP was Patient's last menstrual period was 11/18/2022 (approximate)., presents today for her annual examination.  Her menses are Q3 months on POPs, lasting a few days, mod flow, occas BTB, no dysmen. Was on seasonale OCPs but changed to POPs due to HTN.   Sex activity: single partner, contraception - POP. No pain/bleeding usually. Is having LLQ pain due to hx of recent LTO hemorrhagic cyst. Pt with LLQ pain starting 11/23 with 4.4 cm LTO cyst. Sx improved 12/23 and now increasing again. Pain is dull ache, intermittent but more frequent now. Was constant 11/23. Has f/u GYN u/s appt tomorrow.   Hx of 5 cm RT ovar dermoid cyst. S/p BSO 2/23 with Dr. Tiburcio Pea with mature teratoma. Doing well.  Last Pap: 08/12/18 Results: no abnormalities/neg HPV DNA Hx of STDs: none  Last mammogram: 09/01/18 at [facility]; results were normal, repeat age 66. Hx of  RT breast mass that was normal fibroglandular tissue and fat lobules on mammo and u/s.  Had mammos at [facility] in past due to fibrocystic breasts. There is no FH of breast cancer. There is no FH of ovarian cancer. The patient does do self-breast exams.   Tobacco use: The patient denies current or previous tobacco use. Alcohol use: social No drug use.  Exercise: min active  She does get adequate calcium and  Vitamin D in her diet. Hx of Vit D deficiency Labs with PCP.  Past Medical History:  Diagnosis Date   Complication of anesthesia    epidural with c section- BP dropped low and had to have an emergency c section   Dermoid cyst of ovary 2019   Dysrhythmia    Hypertension    Hypothyroidism (acquired)    Obesity    sleep apnea    Past Surgical History:  Procedure Laterality Date   CARPAL TUNNEL RELEASE   10/2022   right hand 04/2022   CESAREAN SECTION  2012   LAPAROSCOPIC LYSIS OF ADHESIONS  01/03/2022   Procedure: LAPAROSCOPIC LYSIS OF ADHESIONS;  Surgeon: Nadara Mustard, MD;  Location: [facility] ORS;  Service: Gynecology;;   TONSILLECTOMY      Family History  Problem Relation Age of Onset   Diabetes Mother        DM Type 2   Multiple myeloma Mother 23   Hypertension Father    Diabetes Sister        DM Type 2   Hypertension Sister     Social History   Socioeconomic History   Marital status: Married    Spouse name: [name]   Number of children: 1   Years of education: Not on file   Highest education level: Not on file  Occupational History   Not on file  Tobacco Use   Smoking status: Never   Smokeless tobacco: Never  Vaping Use   Vaping Use: Never used  Substance and Sexual Activity   Alcohol use: Yes    Comment: social   Drug use: Never   Sexual activity: Yes    Birth control/protection: Pill  Other Topics Concern   Not on file  Social History Narrative   Not on file   Social  Determinants of Health   Financial Resource Strain: Not on file  Food Insecurity: Not on file  Transportation Needs: Not on file  Physical Activity: Not on file  Stress: Not on file  Social Connections: Not on file  Intimate Partner Violence: Not on file    Outpatient Medications Prior to Visit  Medication Sig Dispense Refill   calcium carbonate (TUMS - DOSED IN MG ELEMENTAL CALCIUM) 500 MG chewable tablet Chew 2-3 tablets by mouth daily as needed for indigestion or heartburn.     Cholecalciferol (VITAMIN D) 50 MCG (2000 UT) tablet Take 2,000 Units by mouth daily.     fluticasone (FLONASE) 50 MCG/ACT nasal spray Place 1 spray into both nostrils 2 (two) times daily. (Patient taking differently: Place 1 spray into both nostrils daily as needed for allergies.) 18 mL 0   hydrochlorothiazide (HYDRODIURIL) 25 MG tablet Take 1 tablet by mouth daily.     ibuprofen (ADVIL) 200 MG tablet Take 800  mg by mouth every 6 (six) hours as needed for moderate pain.     metoprolol succinate (TOPROL-XL) 50 MG 24 hr tablet Take 50 mg by mouth daily.     norethindrone (MICRONOR) 0.35 MG tablet TAKE 1 TABLET BY MOUTH EVERY DAY 84 tablet 0   oxyCODONE-acetaminophen (PERCOCET/ROXICET) 5-325 MG tablet Take 1 tablet by mouth every 4 (four) hours as needed for moderate pain. 30 tablet 0   No facility-administered medications prior to visit.      ROS:  Review of Systems  Constitutional:  Negative for fatigue, fever and unexpected weight change.  Respiratory:  Negative for cough, shortness of breath and wheezing.   Cardiovascular:  Negative for chest pain, palpitations and leg swelling.  Gastrointestinal:  Negative for blood in stool, constipation, diarrhea, nausea and vomiting.  Endocrine: Negative for cold intolerance, heat intolerance and polyuria.  Genitourinary:  Positive for pelvic pain. Negative for dyspareunia, dysuria, flank pain, frequency, genital sores, hematuria, menstrual problem, urgency, vaginal bleeding, vaginal discharge and vaginal pain.  Musculoskeletal:  Negative for back pain, joint swelling and myalgias.  Skin:  Negative for rash.  Neurological:  Negative for dizziness, syncope, light-headedness, numbness and headaches.  Hematological:  Negative for adenopathy.  Psychiatric/Behavioral:  Negative for agitation, confusion, sleep disturbance and suicidal ideas. The patient is not nervous/anxious.   BREAST: mass   Objective: BP 129/89   Pulse 99   Ht 5\' 7"  (1.702 m)   Wt 271 lb (122.9 kg)   LMP 11/18/2022 (Approximate)   BMI 42.44 kg/m    Physical Exam Constitutional:      Appearance: She is well-developed.  Genitourinary:     Vulva normal.     Right Labia: No rash, tenderness or lesions.    Left Labia: No tenderness, lesions or rash.    No vaginal discharge, erythema or tenderness.      Right Adnexa: not tender and no mass present.    Left Adnexa: not tender and  no mass present.    No cervical friability or polyp.     Uterus is not enlarged or tender.  Breasts:    Right: No mass, nipple discharge, skin change or tenderness.     Left: No mass, nipple discharge, skin change or tenderness.  Neck:     Thyroid: No thyromegaly.  Cardiovascular:     Rate and Rhythm: Normal rate and regular rhythm.     Heart sounds: Normal heart sounds. No murmur heard. Pulmonary:     Effort: Pulmonary effort is normal.  Breath sounds: Normal breath sounds.  Abdominal:     Palpations: Abdomen is soft.     Tenderness: There is no abdominal tenderness. There is no guarding or rebound.  Musculoskeletal:        General: Normal range of motion.     Cervical back: Normal range of motion.  Lymphadenopathy:     Cervical: No cervical adenopathy.  Neurological:     General: No focal deficit present.     Mental Status: She is alert and oriented to person, place, and time.     Cranial Nerves: No cranial nerve deficit.  Skin:    General: Skin is warm and dry.  Psychiatric:        Mood and Affect: Mood normal.        Behavior: Behavior normal.        Thought Content: Thought content normal.        Judgment: Judgment normal.  Vitals reviewed.    Assessment/Plan: Encounter for annual routine gynecological examination  Cervical cancer screening - Plan: Cytology - PAP  Screening for HPV (human papillomavirus) - Plan: Cytology - PAP  Encounter for surveillance of contraceptive pills - Plan: norethindrone (MICRONOR) 0.35 MG tablet; Rx RF.   Cyst of left ovary--has f/u u/s tomorrow. Will f/u with results and mgmt  LLQ pain--neg exam, hx of LTO cyst.            Meds ordered this encounter  Medications   norethindrone (MICRONOR) 0.35 MG tablet    Sig: Take 1 tablet (0.35 mg total) by mouth daily.    Dispense:  84 tablet    Refill:  3    Order Specific Question:   Supervising Provider    Answer:   Waymon Budge    GYN counsel adequate intake of  calcium and vitamin D, diet and exercise     F/U  Return in about 1 year (around 12/18/2023).  Ziare Orrick B. Abas Leicht, PA-C 12/17/2022 4:34 PM

## 2022-12-17 ENCOUNTER — Ambulatory Visit (INDEPENDENT_AMBULATORY_CARE_PROVIDER_SITE_OTHER): Payer: BC Managed Care – PPO | Admitting: Obstetrics and Gynecology

## 2022-12-17 ENCOUNTER — Encounter: Payer: Self-pay | Admitting: Obstetrics and Gynecology

## 2022-12-17 ENCOUNTER — Other Ambulatory Visit (HOSPITAL_COMMUNITY)
Admission: RE | Admit: 2022-12-17 | Discharge: 2022-12-17 | Disposition: A | Discharge status: Home / Self Care | Payer: BC Managed Care – PPO | Source: Ambulatory Visit | Attending: Obstetrics and Gynecology | Admitting: Obstetrics and Gynecology

## 2022-12-17 VITALS — BP 129/89 | HR 99 | Ht 67.0 in | Wt 271.0 lb

## 2022-12-17 DIAGNOSIS — Z3041 Encounter for surveillance of contraceptive pills: Secondary | ICD-10-CM

## 2022-12-17 DIAGNOSIS — N83202 Unspecified ovarian cyst, left side: Secondary | ICD-10-CM | POA: Diagnosis not present

## 2022-12-17 DIAGNOSIS — Z1151 Encounter for screening for human papillomavirus (HPV): Secondary | ICD-10-CM | POA: Insufficient documentation

## 2022-12-17 DIAGNOSIS — Z124 Encounter for screening for malignant neoplasm of cervix: Secondary | ICD-10-CM

## 2022-12-17 DIAGNOSIS — R1032 Left lower quadrant pain: Secondary | ICD-10-CM

## 2022-12-17 DIAGNOSIS — Z01411 Encounter for gynecological examination (general) (routine) with abnormal findings: Secondary | ICD-10-CM | POA: Diagnosis not present

## 2022-12-17 DIAGNOSIS — Z01419 Encounter for gynecological examination (general) (routine) without abnormal findings: Secondary | ICD-10-CM

## 2022-12-17 MED ORDER — NORETHINDRONE 0.35 MG PO TABS
1.0000 | ORAL_TABLET | Freq: Every day | ORAL | 3 refills | Status: AC
Start: 2022-12-17 — End: ?

## 2022-12-17 NOTE — Telephone Encounter (Signed)
I changed the order. Pt had u/s tomorrow in [place]. I then see your msg to her. Thx

## 2022-12-17 NOTE — Patient Instructions (Signed)
I value your feedback and you entrusting us with your care. If you get a [facility] patient survey, I would appreciate you taking the time to let us know about your experience today. Thank you! ? ? ?

## 2022-12-17 NOTE — Addendum Note (Signed)
Addended by: Ardeth Perfect B on: 0/94/0768 10:59 AM   Modules accepted: Orders

## 2022-12-18 ENCOUNTER — Ambulatory Visit: Payer: BC Managed Care – PPO

## 2022-12-20 LAB — CYTOLOGY - PAP
Adequacy: ABSENT
Comment: NEGATIVE
Diagnosis: NEGATIVE
High risk HPV: NEGATIVE

## 2022-12-25 NOTE — Telephone Encounter (Signed)
Called pt, no answer, LVMTRC. 

## 2022-12-25 NOTE — Telephone Encounter (Signed)
Called pt, no answer, did not leave voice msg this time.

## 2022-12-27 ENCOUNTER — Telehealth: Payer: Self-pay

## 2022-12-27 NOTE — Telephone Encounter (Signed)
Called pt, no answer, LVMTRC. 

## 2023-01-20 ENCOUNTER — Telehealth: Payer: Self-pay

## 2023-01-20 NOTE — Telephone Encounter (Signed)
Patient left a voicemail stating she saw that the next available appointment was with Dr. Marius Ditch in may and wanted to know if this was correct. Return patient call and informed her yes that is the next available appointment. Informed her she is more then welcome to scheduled a appointment through her mychart but if we scheduled a appointment over the phone I would need a referral referring over to our office.

## 2023-05-26 ENCOUNTER — Other Ambulatory Visit: Payer: Self-pay | Admitting: Physician Assistant

## 2023-05-26 DIAGNOSIS — M542 Cervicalgia: Secondary | ICD-10-CM

## 2023-05-31 ENCOUNTER — Ambulatory Visit
Admission: RE | Admit: 2023-05-31 | Discharge: 2023-05-31 | Disposition: A | Discharge status: Home / Self Care | Payer: BC Managed Care – PPO | Source: Ambulatory Visit | Attending: Physician Assistant | Admitting: Physician Assistant

## 2023-05-31 DIAGNOSIS — M542 Cervicalgia: Secondary | ICD-10-CM | POA: Diagnosis present

## 2023-06-05 ENCOUNTER — Other Ambulatory Visit: Payer: Self-pay | Admitting: Physician Assistant

## 2023-06-05 DIAGNOSIS — G8929 Other chronic pain: Secondary | ICD-10-CM

## 2023-06-13 ENCOUNTER — Ambulatory Visit
Admission: RE | Admit: 2023-06-13 | Discharge: 2023-06-13 | Disposition: A | Discharge status: Home / Self Care | Payer: BC Managed Care – PPO | Source: Ambulatory Visit | Attending: Physician Assistant | Admitting: Physician Assistant

## 2023-06-13 DIAGNOSIS — G8929 Other chronic pain: Secondary | ICD-10-CM | POA: Insufficient documentation

## 2023-06-13 DIAGNOSIS — M5442 Lumbago with sciatica, left side: Secondary | ICD-10-CM | POA: Diagnosis present

## 2023-06-17 NOTE — Progress Notes (Signed)
Referring Physician:  Patrice Paradise, MD [address] [facility] [place],  [place] [address]  Primary Physician:  Dana Paradise, MD  History of Present Illness: 06/30/2023 [name] is here today with a chief complaint of back and neck pain.  In regards to her neck and upper extremity pain she complains of neck tightness and stiffness that often radiates into her periscapular region and up into the posterior scalp.  She states that even while sleeping she will wake up with severe soreness in her left scalp that radiates up to the top of her head.  There is a place that is tender to light touch.  Often feels like a bruise.  She feels that this is getting worse over the past few months.  Because of this she has had a workup including an MRI and was referred here for evaluation.  She has not had significant weakness in her left upper extremity however it does not feel strong as her right upper extremity.  She has had a previous carpal tunnel surgery for which she continues to have some difficulty on the left side with some triggering and lack of full improvement.  She also has pain in her low back that radiates down her left lower extremity.  It goes from her buttocks down the back of her leg to the bottom of her foot.  She states that this does get worse with activity.  It gets worse with lying on her side.  She will often wake up and have numbness going down the back of her leg.  She does feel that it feels a slightly slower and less full functioning.  It gets worse as the day goes on.  The pain can be quite bothersome and can make it difficult for her to stand and prolonged periods.  She has a hard time getting into a comfortable position.   Conservative measures:  Physical therapy:   denies  Multimodal medical therapy including regular antiinflammatories: Ibuprofen, Prednisone Injections:  denies epidural steroid injections  The symptoms are causing a  significant impact on the patient's life.   I have utilized the care everywhere function in epic to review the outside records available from external health systems.  Review of Systems:  A 10 point review of systems is negative, except for the pertinent positives and negatives detailed in the HPI.  Past Medical History: Past Medical History:  Diagnosis Date   Complication of anesthesia    epidural with c section- BP dropped low and had to have an emergency c section   Dermoid cyst of ovary 2019   Dysrhythmia    Hypertension    Hypothyroidism (acquired)    Obesity    sleep apnea    Past Surgical History: Past Surgical History:  Procedure Laterality Date   CARPAL TUNNEL RELEASE  10/2022   right hand 04/2022   CESAREAN SECTION  2012   LAPAROSCOPIC LYSIS OF ADHESIONS  01/03/2022   Procedure: LAPAROSCOPIC LYSIS OF ADHESIONS;  Surgeon: Nadara Mustard, MD;  Location: [facility] ORS;  Service: Gynecology;;   TONSILLECTOMY      Allergies: Allergies as of 06/30/2023 - Review Complete 06/30/2023  Allergen Reaction Noted   Hydrocodone Itching and Swelling 01/13/2012    Medications:  Current Outpatient Medications:    calcium carbonate (TUMS - DOSED IN MG ELEMENTAL CALCIUM) 500 MG chewable tablet, Chew 2-3 tablets by mouth daily as needed for indigestion or heartburn., Disp: , Rfl:  Cholecalciferol (VITAMIN D) 50 MCG (2000 UT) tablet, Take 2,000 Units by mouth daily., Disp: , Rfl:    fluticasone (FLONASE) 50 MCG/ACT nasal spray, Place 1 spray into both nostrils 2 (two) times daily. (Patient taking differently: Place 1 spray into both nostrils daily as needed for allergies.), Disp: 18 mL, Rfl: 0   hydrochlorothiazide (HYDRODIURIL) 25 MG tablet, Take 1 tablet by mouth daily., Disp: , Rfl:    ibuprofen (ADVIL) 200 MG tablet, Take 800 mg by mouth every 6 (six) hours as needed for moderate pain., Disp: , Rfl:    metoprolol succinate (TOPROL-XL) 50 MG 24 hr tablet, Take 50 mg by mouth daily.,  Disp: , Rfl:    norethindrone (MICRONOR) 0.35 MG tablet, Take 1 tablet (0.35 mg total) by mouth daily., Disp: 84 tablet, Rfl: 3  Social History: Social History   Tobacco Use   Smoking status: Never   Smokeless tobacco: Never  Vaping Use   Vaping status: Never Used  Substance Use Topics   Alcohol use: Yes    Comment: social   Drug use: Never    Family Medical History: Family History  Problem Relation Age of Onset   Diabetes Mother        DM Type 2   Multiple myeloma Mother 41   Hypertension Father    Diabetes Sister        DM Type 2   Hypertension Sister     Physical Examination: Vitals:   06/30/23 1524  BP: 126/74    General: Patient is in no apparent distress. Attention to examination is appropriate.  Neck:   Supple.  Full range of motion.  Respiratory: Patient is breathing without any difficulty.   NEUROLOGICAL:     Awake, alert, oriented to person, place, and time.  Speech is clear and fluent.   Cranial Nerves: Pupils equal round and reactive to light.  Facial tone is symmetric.  Facial sensation is symmetric. Shoulder shrug is symmetric. Tongue protrusion is midline.    Strength: Side Biceps Triceps Deltoid Interossei Grip Wrist Ext. Wrist Flex.  R 5 5 5 5 5 5 5   L 5 5 5 5 5 5 5    Side Iliopsoas Quads Hamstring PF DF EHL  R 5 5 5 5 5 5   L 5 5 5 5 5 5    Reflexes are 2+ and symmetric at the biceps, triceps, brachioradialis, patella and achilles.   Hoffman's is absent. Clonus is absent  No gross sensation loss in dermatomes noted     No evidence of dysmetria noted.  Gait is normal.    Imaging: Narrative & Impression  CLINICAL DATA:  Chronic left-sided low back pain with sciatica.   EXAM: MRI LUMBAR SPINE WITHOUT CONTRAST   TECHNIQUE: Multiplanar, multisequence MR imaging of the lumbar spine was performed. No intravenous contrast was administered.   COMPARISON:  MRI lumbar spine dated Mar 24, 2015.   FINDINGS: Segmentation:  Standard.    Alignment:  Physiologic.   Vertebrae: No fracture, evidence of discitis, or bone lesion. Slight interval increase in size of an L3 vertebral body hemangioma.   Conus medullaris and cauda equina: Conus extends to the L2 level. Conus and cauda equina appear normal.   Paraspinal and other soft tissues: Negative.   Disc levels:   T12-L1 to L3-L4: Negative.   L4-L5: Progressive mild disc bulging with new superimposed small right paracentral disc protrusion and annular fissure. New mild right lateral recess stenosis and borderline mild left neuroforaminal stenosis. No spinal canal  or left neuroforaminal stenosis.   L5-S1: Progressive mild disc bulging with increasing superimposed central to left subarticular disc protrusion encroaching on the descending left S1 nerve root. New mild left lateral recess stenosis. No spinal canal or neuroforaminal stenosis.   IMPRESSION: 1. Progressive mild degenerative disc disease at L4-L5 and L5-S1. Increasing central to left subarticular disc protrusion at L5-S1 encroaching on the descending left S1 nerve root.     Electronically Signed   By: Obie Dredge M.D.   On: 06/23/2023 14:35   0 Narrative & Impression  CLINICAL DATA:  Initial evaluation for neck pain with radiation to the base of skull.   EXAM: MRI CERVICAL SPINE WITHOUT CONTRAST   TECHNIQUE: Multiplanar, multisequence MR imaging of the cervical spine was performed. No intravenous contrast was administered.   COMPARISON:  Prior radiograph from 05/26/2023.   FINDINGS: Alignment: Straightening of the normal cervical lordosis. Trace 2 mm anterolisthesis of C6 on C7.   Vertebrae: Vertebral body height maintained without acute or chronic fracture. Diffuse loss of normal bone marrow signal, nonspecific but can be seen with anemia, smoking, obesity, and infiltrative/myelofibrotic marrow processes. No discrete or worrisome osseous lesions. No abnormal marrow edema.   Cord:  Normal signal and morphology.   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 Paraspinous soft tissues within normal limits. Normal flow voids seen within the vertebral arteries bilaterally.   Disc levels:   C2-C3: Normal interspace. Mild left-sided facet hypertrophy. No canal or foraminal stenosis.   C3-C4: Mild disc bulge with uncovertebral spurring. No significant spinal stenosis. Foramina remain patent.   C4-C5: Normal interspace. Mild right-sided facet hypertrophy. No canal or foraminal stenosis.   C5-C6: Diffuse disc bulge with bilateral uncovertebral spurring. Superimposed right paracentral disc protrusion flattens and partially effaces the ventral thecal sac. Mild spinal stenosis without frank cord impingement. Moderate right C6 foraminal narrowing. Left neural foramina remains adequately patent.   C6-C7: Trace anterolisthesis. Disc desiccation with mild disc bulge. Associated endplate and uncovertebral spurring. Superimposed small left paracentral disc protrusion mildly indents the left ventral thecal sac (series 8, image 25). Mild cord flattening without cord signal changes. Mild spinal stenosis. Foramina remain patent.   C7-T1: Mild uncovertebral spurring without significant disc bulge. No spinal stenosis. Foramina remain patent.   IMPRESSION: 1. Right paracentral disc protrusion with uncovertebral spurring at C5-6 with resultant mild canal and moderate right C6 foraminal stenosis. 2. Small left paracentral disc protrusion at C6-7 with resultant mild spinal stenosis. 3. 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     Electronically Signed   By: Rise Mu M.D.   On: 06/05/2023 17:46    I have personally reviewed the images and agree with the above interpretation.  Notably while reviewing her MRI her tonsillar ectopia appears to be rounded  without clear PEG shaping.  She has mild spinal stenosis in the cervical spine but without any areas of severe compression.  We discussed that her cervical foraminal stenosis is actually worse on the right than it is on her left.  In her lumbar spine L5-S1 disc herniation is causing impingement on the traversing S1 nerve root.  Medical Decision Making/Assessment and Plan: Cervicalgia Occipital neuralgia of left side Lumbar Radiculopathy  Ms. Scites is a pleasant 37 y.o. female with complaints of both neck and lower back issues.  In regards to her neck, she has had this for multiple months, it is worse with activity and prolonged standing.  She gets pain in the back of her neck that is mostly unilateral radiating up to the left as back of her scalp and the left periscapular region.  She gets soreness at this level if she sleeps on it for too long.  Often it is involved with headaches.  On physical examination she does have a slight Tinel's sign at the greater occipital nerve.  We do feel that she would benefit from a greater occipital nerve injection.  She may have a component of occipital neuralgia.  Her headaches could also be cervicogenic in origin as she does have a loss of her cervical lordosis on her cross-sectional imaging.  There is some evidence of early spondylosis at the lower aspect of the cervical spine.  These areas do not look overtly compressive however and her exam is reassuring for radiculopathy.  In regards to her lower extremity symptoms, she has had a history of back pain previously.  She has had an MRI which showed a disc bulge, however that is progressed at the L5-S1 level.  At this point it is compressing on the traversing S1 nerve root.  She has not ever had a lumbar spinal injection for this problem.  Will plan on making a referral for transforaminal lumbar spine injections to help with the left-sided radiculopathy.  This does localize well to the S1 nerve as it goes down the back  of her leg to the bottom of her foot.  She often feels like she has pain on the bottom of her foot and decree sensation.  Overall she has not yet had physical therapy.  We do feel like she would benefit from both the cervical and lumbar standpoint given her cervicalgia some spondylosis, and low back pain with radiation down into her leg.  We like to continue to follow her closely.  Would like to follow-up after her interventions to see how she is doing.  We have offered her follow-up visit.  Thank you for involving me in the care of this patient.    Lovenia Kim MD/MSCR Neurosurgery

## 2023-06-24 ENCOUNTER — Encounter: Payer: Self-pay | Admitting: Neurosurgery

## 2023-06-30 ENCOUNTER — Ambulatory Visit (INDEPENDENT_AMBULATORY_CARE_PROVIDER_SITE_OTHER): Payer: BC Managed Care – PPO | Admitting: Neurosurgery

## 2023-06-30 ENCOUNTER — Encounter: Payer: Self-pay | Admitting: Neurosurgery

## 2023-06-30 VITALS — BP 126/74 | Ht 67.0 in | Wt 257.6 lb

## 2023-06-30 DIAGNOSIS — M542 Cervicalgia: Secondary | ICD-10-CM | POA: Diagnosis not present

## 2023-06-30 DIAGNOSIS — M5481 Occipital neuralgia: Secondary | ICD-10-CM | POA: Diagnosis not present

## 2023-06-30 DIAGNOSIS — M5416 Radiculopathy, lumbar region: Secondary | ICD-10-CM

## 2023-06-30 NOTE — Patient Instructions (Signed)
 LOCAL PHYSICAL THERAPY  [facility]  [address]  [place], [place] [address]  [phone]  [facility]  [address] [place], [place] [address]  [phone]  [facility] (2 locations)  [address]  [place], [place] [address]  [phone]          or  [address]  [place], [place] [address]  [phone]  [facility]  [address]  [place], [place] [address]  [phone]  **dry needling**  [facility])  [address]  [place], [place] [address]  [phone]  Fax: [phone]  ** Aquatic therapy[address] [place], [place] [address] [phone] **Aquatic therapy**  [facility]  [facility]  [address]  [place], [place] [address]  [phone]  [facility]  [address]  [place], [place] [address]  [phone]  [facility]  [address]   Janora Norlander  [place], [place] [address]  [phone]  [facility]  [address]  [place], [place] [address]  [phone]  **dry needling**   PELVIC FLOOR/SI JOINT  [facility]  Mariane Masters, PT  [email or web address]   [place]  [facility]  [address]  [address] [place], [place] [address]  [phone]   [facility] [place]  [address], [place] [address]  [phone]   [facility], [place]  [facility]  Raymond Gurney, PT  [address]  [place], [place] [address] [phone]   Samara Deist  [facility]  [address]  [phone]   [facility]  [address]  [phone]   [facility]  Annamaria Boots [facility]  [address]  [place], [place] [address]  [phone]   **No Aetna or medicaid**  [facility]  [phone] [address] [place] [address]  [phone]  [facility]  [address]  [place], [place] [address]  [phone]  Dannial Monarch, DC  [address], [place] [address]  [phone]  [facility]  [address], [place] [address]  [phone]  [facility]  [address]  [place], [place] [address]  [phone]  [facility]  (3 locations)  [address]  [place], [place] [address]  [phone]  **dry needling**           or  [address] [place], [place] [address]  [phone]  **Additionally has Gloris Manchester, OT**           or  [address]  [place], [place] [address]  [phone]  **Pediatric therapy**  [facility]  [address]  [phone]  **dry needlingVerdie Drown [facility]  [address]  [place], [place] [address]  [phone]  [facility]   (Inside [address] [facility])  [address]  [place], [place] [address]  [phone]  **dry needling**  **MEDICAID or UNINSURED** [facility] dept. Of Physical Therapy [place], [place] [address] [phone]  [facility]  [address] [place], [place] [address]  [phone]   [facility]  [address]  [place], [place] [address]  [phone]   Doreatha Martin  [facility]  [address] [place], [place] [address]  [phone]   [facility]  [address]  [place], [place] [address]  [phone]   [facility]  [address] [place], [place] [address]  [phone]  [facility]  [address]  [place], [place] [address]  [phone]  AQUATIC  Kathalene Frames [facility]  [employer]  [facility]  [place]  [place]  *Residents only*   [facility] [employer]  *[facility]  Exercise class  [facility]  Exercise class  [facility]  [address]  [place], [place]   [phone]  [facility]  [address]  [place], [place] [address]  [phone]   [place], [place]  [facility]  [address]  [phone]   [facility]  [facility]  [address]  [phone]           or  [address]  [phone]   [facility]   Provides education and support and practical information for coping with arthritis for arthritis sufferers and their families.   When: 12:15 - 1:30 p.m. the second Monday of each month, March through December  Info: Call Rehabilitation Services at [phone]

## 2023-07-14 ENCOUNTER — Other Ambulatory Visit (INDEPENDENT_AMBULATORY_CARE_PROVIDER_SITE_OTHER): Payer: BC Managed Care – PPO | Admitting: Neurosurgery

## 2023-07-14 DIAGNOSIS — M5481 Occipital neuralgia: Secondary | ICD-10-CM

## 2023-07-14 DIAGNOSIS — G8929 Other chronic pain: Secondary | ICD-10-CM

## 2023-07-14 DIAGNOSIS — R519 Headache, unspecified: Secondary | ICD-10-CM

## 2023-07-14 NOTE — Progress Notes (Signed)
Given patient's chronic head pain with radiating pain up into her posterior scalp there is some concern that there may be intracranial involvement.  Given this question would like to obtain a head CT to evaluate for any mass effect or abnormalities that could be causing her symptoms.  Orders placed today.

## 2023-07-17 ENCOUNTER — Ambulatory Visit
Payer: BC Managed Care – PPO | Attending: Student in an Organized Health Care Education/Training Program | Admitting: Student in an Organized Health Care Education/Training Program

## 2023-07-17 ENCOUNTER — Encounter: Payer: Self-pay | Admitting: Student in an Organized Health Care Education/Training Program

## 2023-07-17 VITALS — BP 131/78 | HR 82 | Temp 97.4°F | Resp 16 | Ht 67.0 in | Wt 250.0 lb

## 2023-07-17 DIAGNOSIS — M5481 Occipital neuralgia: Secondary | ICD-10-CM

## 2023-07-17 DIAGNOSIS — M48061 Spinal stenosis, lumbar region without neurogenic claudication: Secondary | ICD-10-CM | POA: Diagnosis not present

## 2023-07-17 DIAGNOSIS — M5020 Other cervical disc displacement, unspecified cervical region: Secondary | ICD-10-CM

## 2023-07-17 DIAGNOSIS — G894 Chronic pain syndrome: Secondary | ICD-10-CM

## 2023-07-17 DIAGNOSIS — M5416 Radiculopathy, lumbar region: Secondary | ICD-10-CM | POA: Diagnosis not present

## 2023-07-17 MED ORDER — GABAPENTIN 300 MG PO CAPS: ORAL_CAPSULE | ORAL | 0 refills | Status: AC

## 2023-07-17 NOTE — Patient Instructions (Addendum)
Preparing for Procedure with Sedation Instructions: Oral Intake: Do not eat or drink anything for at least 8 hours prior to your procedure. Transportation: Public transportation is not allowed. Bring an adult driver. The driver must be physically present in our waiting room before any procedure can be started. Physical Assistance: Bring an adult capable of physically assisting you, in the event you need help. Blood Pressure Medicine: Take your blood pressure medicine with a sip of water the morning of the procedure. Insulin: Take only  of your normal insulin dose. Preventing infections: Shower with an antibacterial soap the morning of your procedure. Build-up your immune system: Take 1000 mg of Vitamin C with every meal (3 times a day) the day prior to your procedure. Pregnancy: If you are pregnant, call and cancel the procedure. Sickness: If you have a cold, fever, or any active infections, call and cancel the procedure. Arrival: You must be in the facility at least 30 minutes prior to your scheduled procedure. Children: Do not bring children with you. Dress appropriately: Bring dark clothing that you would not mind if they get stained. Valuables: Do not bring any jewelry or valuables. Procedure appointments are reserved for interventional treatments only. No Prescription Refills. No medication changes will be discussed during procedure appointments. No disability issues will be discussed. Gabapentin Capsules or Tablets What is this medication? GABAPENTIN (GA ba pen tin) treats nerve pain. It may also be used to prevent and control seizures in people with epilepsy. It works by calming overactive nerves in your body. This medicine may be used for other purposes; ask your health care provider or pharmacist if you have questions. COMMON BRAND NAME(S): Active-PAC with Gabapentin, Ascencion Dike, Gralise, Neurontin What should I tell my care team before I take this medication? They need to know if you  have any of these conditions: Kidney disease Lung or breathing disease Substance use disorder Suicidal thoughts, plans, or attempt by you or a family member An unusual or allergic reaction to gabapentin, other medications, foods, dyes, or preservatives Pregnant or trying to get pregnant Breastfeeding How should I use this medication? Take this medication by mouth with a glass of water. Follow the directions on the prescription label. You can take it with or without food. If it upsets your stomach, take it with food. Take your medication at regular intervals. Do not take it more often than directed. Do not stop taking except on your care team's advice. If you are directed to break the 600 or 800 mg tablets in half as part of your dose, the extra half tablet should be used for the next dose. If you have not used the extra half tablet within 28 days, it should be thrown away. A special MedGuide will be given to you by the pharmacist with each prescription and refill. Be sure to read this information carefully each time. Talk to your care team about the use of this medication in children. While this medication may be prescribed for children as young as 3 years for selected conditions, precautions do apply. Overdosage: If you think you have taken too much of this medicine contact a poison control center or emergency room at once. NOTE: This medicine is only for you. Do not share this medicine with others. What if I miss a dose? If you miss a dose, take it as soon as you can. If it is almost time for your next dose, take only that dose. Do not take double or extra doses. What may interact  with this medication? Alcohol Antihistamines for allergy, cough, and cold Certain medications for anxiety or sleep Certain medications for depression like amitriptyline, fluoxetine, sertraline Certain medications for seizures like phenobarbital, primidone Certain medications for stomach problems General  anesthetics like halothane, isoflurane, methoxyflurane, propofol Local anesthetics like lidocaine, pramoxine, tetracaine Medications that relax muscles for surgery Opioid medications for pain Phenothiazines like chlorpromazine, mesoridazine, prochlorperazine, thioridazine This list may not describe all possible interactions. Give your health care provider a list of all the medicines, herbs, non-prescription drugs, or dietary supplements you use. Also tell them if you smoke, drink alcohol, or use illegal drugs. Some items may interact with your medicine. What should I watch for while using this medication? Visit your care team for regular checks on your progress. You may want to keep a record at home of how you feel your condition is responding to treatment. You may want to share this information with your care team at each visit. You should contact your care team if your seizures get worse or if you have any new types of seizures. Do not stop taking this medication or any of your seizure medications unless instructed by your care team. Stopping your medication suddenly can increase your seizures or their severity. This medication may cause serious skin reactions. They can happen weeks to months after starting the medication. Contact your care team right away if you notice fevers or flu-like symptoms with a rash. The rash may be red or purple and then turn into blisters or peeling of the skin. Or, you might notice a red rash with swelling of the face, lips or lymph nodes in your neck or under your arms. Wear a medical identification bracelet or chain if you are taking this medication for seizures. Carry a card that lists all your medications. This medication may affect your coordination, reaction time, or judgment. Do not drive or operate machinery until you know how this medication affects you. Sit up or stand slowly to reduce the risk of dizzy or fainting spells. Drinking alcohol with this medication can  increase the risk of these side effects. Your mouth may get dry. Chewing sugarless gum or sucking hard candy, and drinking plenty of water may help. Watch for new or worsening thoughts of suicide or depression. This includes sudden changes in mood, behaviors, or thoughts. These changes can happen at any time but are more common in the beginning of treatment or after a change in dose. Call your care team right away if you experience these thoughts or worsening depression. If you become pregnant while using this medication, you may enroll in the [place] American Antiepileptic Drug Pregnancy Registry by calling 340-581-2563. This registry collects information about the safety of antiepileptic medication use during pregnancy. What side effects may I notice from receiving this medication? Side effects that you should report to your care team as soon as possible: Allergic reactions or angioedema--skin rash, itching, hives, swelling of the face, eyes, lips, tongue, arms, or legs, trouble swallowing or breathing Rash, fever, and swollen lymph nodes Thoughts of suicide or self harm, worsening mood, feelings of depression Trouble breathing Unusual changes in mood or behavior in children after use such as difficulty concentrating, hostility, or restlessness Side effects that usually do not require medical attention (report to your care team if they continue or are bothersome): Dizziness Drowsiness Nausea Swelling of ankles, feet, or hands Vomiting This list may not describe all possible side effects. Call your doctor for medical advice about side effects. You  may report side effects to FDA at 1-800-FDA-1088. Where should I keep my medication? Keep out of reach of children and pets. Store at room temperature between 15 and 30 degrees C (59 and 86 degrees F). Get rid of any unused medication after the expiration date. This medication may cause accidental overdose and death if taken by other adults, children,  or pets. To get rid of medications that are no longer needed or have expired: Take the medication to a medication take-back program. Check with your pharmacy or law enforcement to find a location. If you cannot return the medication, check the label or package insert to see if the medication should be thrown out in the garbage or flushed down the toilet. If you are not sure, ask your care team. If it is safe to put it in the trash, empty the medication out of the container. Mix the medication with cat litter, dirt, coffee grounds, or other unwanted substance. Seal the mixture in a bag or container. Put it in the trash. NOTE: This sheet is a summary. It may not cover all possible information. If you have questions about this medicine, talk to your doctor, pharmacist, or health care provider.  2024 Elsevier/Gold Standard (2022-08-20 00:00:00) Selective Nerve Root Block  Selective nerve root block is a procedure to inject a numbing medicine (anesthetic) into an area of the spine where a nerve leaves the spinal cord (nerve root). In most cases, a strong anti-inflammatory medicine (steroid) is also injected. This procedure is often done to help diagnose the source of pain in the back, leg, neck, or arm. The injection may temporarily reduce or stop pain in the part of the body that is supplied by the nerve. A nerve root block can be done in the upper (cervical), middle (thoracic), or lower (lumbar) spine. It can also be done at the base of the spine (sacrum). During the procedure, a long needle (spinal needle) will be inserted between the spinal bones (vertebrae) to inject medicine into the nerve root. Tell a health care provider about: Any allergies you have. All medicines you are taking, including vitamins, herbs, eye drops, creams, and over-the-counter medicines. Any problems you or family members have had with anesthetic medicines. Any bleeding problems you have. Any surgeries you have. Any medical  conditions you have. Whether you are pregnant or may be pregnant or you are breastfeeding. What are the risks? Generally, this is a safe procedure. However, problems may occur, including: Infection. Bleeding. Allergic reactions to medicines or dyes. Damage to nerves or blood vessels. Leaking of spinal fluid, causing a severe headache (spinal headache). A temporary increase in pain right after the procedure. Muscle weakness or a loss of movement or feeling (paralysis). Temporary nausea, diarrhea, or facial warmth and redness (flushing), if a steroid is injected. What happens before the procedure? When to stop eating and drinking Follow instructions from your health care provider about what you may eat and drink before your procedure. These may include: 8 hours before your procedure Stop eating most foods. Do not eat meat, fried foods, or fatty foods. Eat only light foods, such as toast or crackers. All liquids are okay except energy drinks and alcohol. 6 hours before your procedure Stop eating. Drink only clear liquids, such as water, clear fruit juice, black coffee, plain tea, and sports drinks. Do not drink energy drinks or alcohol. 2 hours before your procedure Stop drinking all liquids. You may be allowed to take medicines with small sips of water.  If you do not follow your health care provider's instructions, your procedure may be delayed or canceled. General instructions Ask your health care provider about: Changing or stopping your regular medicines. This is especially important if you are taking diabetes medicines or blood thinners. Taking medicines such as aspirin and ibuprofen. These medicines can thin your blood. Do not take these medicines unless your health care provider tells you to take them. Taking over-the-counter medicines, vitamins, herbs, and supplements. If you will be going home right after the procedure, plan to have a responsible adult: Take you home from the  hospital or clinic. You will not be allowed to drive. Care for you for the time you are told. Ask your health care provider: How your injection site will be marked. What steps will be taken to help prevent infection. These steps may include: Removing hair at the injection site. Washing skin with a germ-killing soap. What happens during the procedure? Medicine will be injected to numb the skin over the area where the spinal injection will be given (local anesthetic). You may be given a medicine to help you relax (sedative). A spinal needle will be inserted through an opening (foramen) in the vertebrae to reach the nerve root (transforaminal injection). X-ray dye (contrast dye) will be injected into the nerve root area. X-rays (fluoroscopy) will be done to make sure the spinal needle is in the correct position. The contrast dye helps show your body structures clearly in the X-ray images. Anesthetic will be injected into the nerve root. Steroid medicine may also be injected. You may be asked if you feel pain relief. The spinal needle will be removed. A bandage (dressing) will be placed over the injection site. The procedure may vary among health care providers and hospitals. What happens after the procedure? The injection may temporarily reduce or eliminate pain in the part of the body that is supplied by the nerve. Your health care provider may ask questions about your pain. Your blood pressure, heart rate, breathing rate, blood oxygen level, strength, and coordination will be monitored for about 30 minutes. If you were given a sedative during the procedure, it can affect you for several hours. Do not drive or operate machinery until your health care provider says that it is safe. Do not drive if the injection causes numbness in a body part needed for driving. You may have a temporary increase in blood sugar due to the steroid. Summary Selective nerve root block is a procedure to inject a  numbing medicine (anesthetic) into an area of the spine where a nerve leaves the spinal cord (nerve root). This procedure may help diagnose the source of pain in the back, leg, neck, or arm. Before the procedure, follow instructions from your health care provider about when to stop eating or drinking. This information is not intended to replace advice given to you by your health care provider. Make sure you discuss any questions you have with your health care provider. Document Revised: 06/29/2021 Document Reviewed: 06/29/2021 Elsevier Patient Education  2024 Elsevier Inc. Occipital Nerve Block Patient Information  Description: The occipital nerves originate in the cervical (neck) spinal cord and travel upward through muscle and tissue to supply sensation to the back of the head and top of the scalp.  In addition, the nerves control some of the muscles of the scalp.  Occipital neuralgia is an irritation of these nerves which can cause headaches, numbness of the scalp, and neck discomfort.     The occipital  nerve block will interrupt nerve transmission through these nerves and can relieve pain and spasm.  The block consists of insertion of a small needle under the skin in the back of the head to deposit local anesthetic (numbing medicine) and/or steroids around the nerve.  The entire block usually lasts less than 5 minutes.  Conditions which may be treated by occipital blocks:  Muscular pain and spasm of the scalp Nerve irritation, back of the head Headaches Upper neck pain  Preparation for the injection:  Do not eat any solid food or dairy products within 8 hours of your appointment. You may drink clear liquids up to 3 hours before appointment.  Clear liquids include water, black coffee, juice or soda.  No milk or cream please. You may take your regular medication, including pain medications, with a sip of water before you appointment.  Diabetics should hold regular insulin (if taken  separately) and take 1/2 normal NPH dose the morning of the procedure.  Carry some sugar containing items with you to your appointment. A driver must accompany you and be prepared to drive you home after your procedure. Bring all your current medications with you. An IV may be inserted and sedation may be given at the discretion of the physician. A blood pressure cuff, EKG, and other monitors will often be applied during the procedure.  Some patients may need to have extra oxygen administered for a short period. You will be asked to provide medical information, including your allergies and medications, prior to the procedure.  We must know immediately if you are taking blood thinners (like Coumadin/Warfarin) or if you are allergic to IV iodine contrast (dye).  We must know if you could possible be pregnant.  Do not wear a high collared shirt or turtleneck.  Tie long hair up in the back if possible.  Possible side-effects:  Bleeding from needle site Infection (rare, may require surgery) Nerve injury (rare) Hair on back of neck can be tinged with iodine scrub (this will wash out) Light-headedness (temporary) Pain at injection site (several days) Decreased blood pressure (rare, temporary) Seizure (very rare)  Call if you experience:  Hives or difficulty breathing ( go to the emergency room) Inflammation or drainage at the injection site(s)  Please note:  Although the local anesthetic injected can often make your painful muscles or headache feel good for several hours after the injection, the pain may return.  It takes 3-7 days for steroids to work.  You may not notice any pain relief for at least one week.  If effective, we will often do a series of injections spaced 3-6 weeks apart to maximally decrease your pain.  If you have any questions, please call [phone] [facility]

## 2023-07-17 NOTE — Progress Notes (Signed)
Patient: Dana Shepherd  Service Category: E/M  Provider: Edward Jolly, MD  DOB: 1986/07/27  DOS: 07/17/2023  Referring Provider: Lovenia Kim, MD  MRN: [id number]  Setting: Ambulatory outpatient  PCP: Patrice Paradise, MD  Type: New Patient  Specialty: Interventional Pain Management    Location: Office  Delivery: Face-to-face     Primary Reason(s) for Visit: Encounter for initial evaluation of one or more chronic problems (new to examiner) potentially causing chronic pain, and posing a threat to normal musculoskeletal function. (Level of risk: High) CC: Headache (Left occipital ) and Back Pain (Lumbar left )  HPI  Dana Shepherd is a 37 y.o. year old, female patient, who comes for the first time to our practice referred by Lovenia Kim, MD for our initial evaluation of her chronic pain. She has Dermoid cyst of right ovary; Essential hypertension; Cyst of left ovary; Cervicalgia; Cervico-occipital neuralgia of left side; Chronic nonintractable headache; Cervical disc herniation; Lumbar radiculopathy; Foraminal stenosis of lumbar region; and Chronic pain syndrome on their problem list. Today she comes in for evaluation of her Headache (Left occipital ) and Back Pain (Lumbar left )  Pain Assessment: Location: Left Head (back lumbar) Radiating: back pain down the left leg to the foot Onset: More than a month ago Duration: Chronic pain Quality: Constant, Discomfort, Numbness, Sharp, Sore Severity: 7 /10 (subjective, self-reported pain score)  Effect on ADL: head is very sore and tender to touch as if she has a bruise on the left side.  sleep disruption.  sitting is difficult Timing: Constant Modifying factors: ibuprofen 800 mg for the back.  she is unable to get any relief for the head pain BP: 131/78  HR: 82  Onset and Duration: Sudden and Present longer than 3 months Cause of pain: Unknown Severity: Getting worse, NAS-11 at its worse: 9/10, NAS-11 at its best: 3/10, NAS-11 now: 6/10,  and NAS-11 on the average: 6/10 Timing: Afternoon and Night Aggravating Factors: Prolonged sitting Alleviating Factors: Hot packs, Lying down, Resting, and Warm showers or baths Associated Problems: Inability to concentrate, Numbness, Tingling, Pain that wakes patient up, and Pain that does not allow patient to sleep Quality of Pain: Sharp, Tender, and Uncomfortable Previous Examinations or Tests: MRI scan Previous Treatments: Stretching exercises  Dana Shepherd is being evaluated for possible interventional pain management therapies for the treatment of her chronic pain.   Dana Shepherd is a pleasant 37 year old female who presents with a chief complaint of left occipital pain along with low back pain with radiation down her left leg to her foot.  She has pain that starts in her left occipital region and radiates superiorly.  She states that her pain is not necessarily in her neck but above that in the back of her skull.  She does have intermittent pain in her left shoulder however she states that this is chronic in nature.  She denies any obvious radiating left arm pain.  She denies any problems with fine motor control or left hand strength.  She notes increased sensitivity in the back of her head which is also present when she lays down.  She has trouble sleeping as a result of the pain and occipital sensitivity.  Of note she has had left carpal tunnel surgery in the past and still has some issues with her hand going numb at times.  She had right carpal tunnel surgery as well and she states that she has no problems after the surgery.  She also endorses low back  pain with radiation into her left leg in a dermatomal fashion.  She has done home stretching exercises.  She denies having tried gabapentin or Lyrica.   Meds   Current Outpatient Medications:    Cholecalciferol (VITAMIN D) 50 MCG (2000 UT) tablet, Take 2,000 Units by mouth daily., Disp: , Rfl:    gabapentin (NEURONTIN) 300 MG capsule, Take  1 capsule (300 mg total) by mouth at bedtime for 15 days, THEN 1 capsule (300 mg total) 2 (two) times daily., Disp: 105 capsule, Rfl: 0   hydrochlorothiazide (HYDRODIURIL) 25 MG tablet, Take 1 tablet by mouth daily., Disp: , Rfl:    ibuprofen (ADVIL) 200 MG tablet, Take 800 mg by mouth every 6 (six) hours as needed for moderate pain., Disp: , Rfl:    metoprolol succinate (TOPROL-XL) 50 MG 24 hr tablet, Take 50 mg by mouth daily., Disp: , Rfl:    norethindrone (MICRONOR) 0.35 MG tablet, Take 1 tablet (0.35 mg total) by mouth daily., Disp: 84 tablet, Rfl: 3   calcium carbonate (TUMS - DOSED IN MG ELEMENTAL CALCIUM) 500 MG chewable tablet, Chew 2-3 tablets by mouth daily as needed for indigestion or heartburn., Disp: , Rfl:    fluticasone (FLONASE) 50 MCG/ACT nasal spray, Place 1 spray into both nostrils 2 (two) times daily. (Patient taking differently: Place 1 spray into both nostrils daily as needed for allergies.), Disp: 18 mL, Rfl: 0  Imaging Review  Cervical Imaging: Cervical MR wo contrast: Results for orders placed during the hospital encounter of 05/31/23  MR CERVICAL SPINE WO CONTRAST  Narrative CLINICAL DATA:  Initial evaluation for neck pain with radiation to the base of skull.  EXAM: MRI CERVICAL SPINE WITHOUT CONTRAST  TECHNIQUE: Multiplanar, multisequence MR imaging of the cervical spine was performed. No intravenous contrast was administered.  COMPARISON:  Prior radiograph from 05/26/2023.  FINDINGS: Alignment: Straightening of the normal cervical lordosis. Trace 2 mm anterolisthesis of C6 on C7.  Vertebrae: Vertebral body height maintained without acute or chronic fracture. Diffuse loss of normal bone marrow signal, nonspecific but can be seen with anemia, smoking, obesity, and infiltrative/myelofibrotic marrow processes. No discrete or worrisome osseous lesions. No abnormal marrow edema.  Cord: Normal signal and morphology.  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 Paraspinous soft tissues within normal limits. Normal flow voids seen within the vertebral arteries bilaterally.  Disc levels:  C2-C3: Normal interspace. Mild left-sided facet hypertrophy. No canal or foraminal stenosis.  C3-C4: Mild disc bulge with uncovertebral spurring. No significant spinal stenosis. Foramina remain patent.  C4-C5: Normal interspace. Mild right-sided facet hypertrophy. No canal or foraminal stenosis.  C5-C6: Diffuse disc bulge with bilateral uncovertebral spurring. Superimposed right paracentral disc protrusion flattens and partially effaces the ventral thecal sac. Mild spinal stenosis without frank cord impingement. Moderate right C6 foraminal narrowing. Left neural foramina remains adequately patent.  C6-C7: Trace anterolisthesis. Disc desiccation with mild disc bulge. Associated endplate and uncovertebral spurring. Superimposed small left paracentral disc protrusion mildly indents the left ventral thecal sac (series 8, image 25). Mild cord flattening without cord signal changes. Mild spinal stenosis. Foramina remain patent.  C7-T1: Mild uncovertebral spurring without significant disc bulge. No spinal stenosis. Foramina remain patent.  IMPRESSION: 1. Right paracentral disc protrusion with uncovertebral spurring at C5-6 with resultant mild canal and moderate right C6 foraminal stenosis. 2. Small left paracentral disc protrusion at C6-7 with resultant mild spinal stenosis. 3. 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   Electronically Signed By: Rise Mu M.D. On: 06/05/2023 17:46   MR LUMBAR SPINE WO CONTRAST  Narrative CLINICAL DATA:  Chronic left-sided low back pain with sciatica.  EXAM: MRI LUMBAR SPINE WITHOUT CONTRAST  TECHNIQUE: Multiplanar, multisequence MR imaging of the lumbar spine was performed. No  intravenous contrast was administered.  COMPARISON:  MRI lumbar spine dated Mar 24, 2015.  FINDINGS: Segmentation:  Standard.  Alignment:  Physiologic.  Vertebrae: No fracture, evidence of discitis, or bone lesion. Slight interval increase in size of an L3 vertebral body hemangioma.  Conus medullaris and cauda equina: Conus extends to the L2 level. Conus and cauda equina appear normal.  Paraspinal and other soft tissues: Negative.  Disc levels:  T12-L1 to L3-L4: Negative.  L4-L5: Progressive mild disc bulging with new superimposed small right paracentral disc protrusion and annular fissure. New mild right lateral recess stenosis and borderline mild left neuroforaminal stenosis. No spinal canal or left neuroforaminal stenosis.  L5-S1: Progressive mild disc bulging with increasing superimposed central to left subarticular disc protrusion encroaching on the descending left S1 nerve root. New mild left lateral recess stenosis. No spinal canal or neuroforaminal stenosis.  IMPRESSION: 1. Progressive mild degenerative disc disease at L4-L5 and L5-S1. Increasing central to left subarticular disc protrusion at L5-S1 encroaching on the descending left S1 nerve root.   Electronically Signed By: Obie Dredge M.D. On: 06/23/2023 14:35    Complexity Note: Imaging results reviewed.                         ROS  Cardiovascular: High blood pressure Pulmonary or Respiratory: No reported pulmonary signs or symptoms such as wheezing and difficulty taking a deep full breath (Asthma), difficulty blowing air out (Emphysema), coughing up mucus (Bronchitis), persistent dry cough, or temporary stoppage of breathing during sleep Neurological: No reported neurological signs or symptoms such as seizures, abnormal skin sensations, urinary and/or fecal incontinence, being born with an abnormal open spine and/or a tethered spinal cord Psychological-Psychiatric: No reported psychological or  psychiatric signs or symptoms such as difficulty sleeping, anxiety, depression, delusions or hallucinations (schizophrenial), mood swings (bipolar disorders) or suicidal ideations or attempts Gastrointestinal: No reported gastrointestinal signs or symptoms such as vomiting or evacuating blood, reflux, heartburn, alternating episodes of diarrhea and constipation, inflamed or scarred liver, or pancreas or irrregular and/or infrequent bowel movements Genitourinary: No reported renal or genitourinary signs or symptoms such as difficulty voiding or producing urine, peeing blood, non-functioning kidney, kidney stones, difficulty emptying the bladder, difficulty controlling the flow of urine, or chronic kidney disease Hematological: No reported hematological signs or symptoms such as prolonged bleeding, low or poor functioning platelets, bruising or bleeding easily, hereditary bleeding problems, low energy levels due to low hemoglobin or being anemic Endocrine: No reported endocrine signs or symptoms such as high or low blood sugar, rapid heart rate due to high thyroid levels, obesity or weight gain due to slow thyroid or thyroid disease Rheumatologic: No reported rheumatological signs and symptoms such as fatigue, joint pain, tenderness, swelling, redness, heat, stiffness, decreased range of motion, with or without associated rash Musculoskeletal: Negative for myasthenia gravis, muscular dystrophy, multiple sclerosis or malignant hyperthermia Work History: Working full time  Allergies  Dana Shepherd is allergic to hydrocodone.  Laboratory Chemistry Profile   Renal Lab Results  Component Value Date   BUN 13 03/27/2020   CREATININE 0.64 03/27/2020   GFRAA >60 03/27/2020   GFRNONAA >60 03/27/2020   PROTEINUR  30 mg/dL 16/08/9603     Electrolytes Lab Results  Component Value Date   NA 135 03/27/2020   K 3.6 03/27/2020   CL 103 03/27/2020   CALCIUM 10.3 03/27/2020     Hepatic Lab Results   Component Value Date   AST 21 10/30/2012   ALT 38 10/30/2012   ALBUMIN 3.9 10/30/2012   ALKPHOS 99 10/30/2012   LIPASE 124 10/30/2012     ID Lab Results  Component Value Date   SARSCOV2NAA NEGATIVE 08/07/2021   PREGTESTUR NEGATIVE 01/03/2022     Bone No results found for: "VD25OH", "VD125OH2TOT", "VW0981XB1", "YN8295AO1", "25OHVITD1", "25OHVITD2", "25OHVITD3", "TESTOFREE", "TESTOSTERONE"   Endocrine Lab Results  Component Value Date   GLUCOSE 98 03/27/2020   GLUCOSEU Negative 10/30/2012   TSH 2.431 03/27/2020     Neuropathy No results found for: "VITAMINB12", "FOLATE", "HGBA1C", "HIV"   CNS No results found for: "COLORCSF", "APPEARCSF", "RBCCOUNTCSF", "WBCCSF", "POLYSCSF", "LYMPHSCSF", "EOSCSF", "PROTEINCSF", "GLUCCSF", "JCVIRUS", "CSFOLI", "IGGCSF", "LABACHR", "ACETBL"   Inflammation (CRP: Acute  ESR: Chronic) No results found for: "CRP", "ESRSEDRATE", "LATICACIDVEN"   Rheumatology No results found for: "RF", "ANA", "LABURIC", "URICUR", "LYMEIGGIGMAB", "LYMEABIGMQN", "HLAB27"   Coagulation Lab Results  Component Value Date   PLT 390 12/26/2021     Cardiovascular Lab Results  Component Value Date   HGB 14.8 12/26/2021   HCT 43.0 12/26/2021     Screening Lab Results  Component Value Date   SARSCOV2NAA NEGATIVE 08/07/2021   PREGTESTUR NEGATIVE 01/03/2022     Cancer No results found for: "CEA", "CA125", "LABCA2"   Allergens No results found for: "ALMOND", "APPLE", "ASPARAGUS", "AVOCADO", "BANANA", "BARLEY", "BASIL", "BAYLEAF", "GREENBEAN", "LIMABEAN", "WHITEBEAN", "BEEFIGE", "REDBEET", "BLUEBERRY", "BROCCOLI", "CABBAGE", "MELON", "CARROT", "CASEIN", "CASHEWNUT", "CAULIFLOWER", "CELERY"     Note: Lab results reviewed.  PFSH  Drug: Dana Shepherd  reports no history of drug use. Alcohol:  reports current alcohol use. Tobacco:  reports that she has never smoked. She has never used smokeless tobacco. Medical:  has a past medical history of Complication of  anesthesia, Dermoid cyst of ovary (2019), Dysrhythmia, Hypertension, Hypothyroidism (acquired), and Obesity. Family: family history includes Diabetes in her mother and sister; Hypertension in her father and sister; Multiple myeloma (age of onset: 41) in her mother.  Past Surgical History:  Procedure Laterality Date   CARPAL TUNNEL RELEASE  10/2022   right hand 04/2022   CESAREAN SECTION  2012   LAPAROSCOPIC LYSIS OF ADHESIONS  01/03/2022   Procedure: LAPAROSCOPIC LYSIS OF ADHESIONS;  Surgeon: Nadara Mustard, MD;  Location: [facility] ORS;  Service: Gynecology;;   TONSILLECTOMY     Active Ambulatory Problems    Diagnosis Date Noted   Dermoid cyst of right ovary 11/23/2019   Essential hypertension 11/23/2019   Cyst of left ovary 12/17/2022   Cervicalgia 06/30/2023   Cervico-occipital neuralgia of left side 07/14/2023   Chronic nonintractable headache 07/14/2023   Cervical disc herniation 07/17/2023   Lumbar radiculopathy 07/17/2023   Foraminal stenosis of lumbar region 07/17/2023   Chronic pain syndrome 07/17/2023   Resolved Ambulatory Problems    Diagnosis Date Noted   No Resolved Ambulatory Problems   Past Medical History:  Diagnosis Date   Complication of anesthesia    Dermoid cyst of ovary 2019   Dysrhythmia    Hypertension    Hypothyroidism (acquired)    Obesity    Constitutional Exam  General appearance: Well nourished, well developed, and well hydrated. In no apparent acute distress Vitals:   07/17/23 1519  BP: 131/78  Pulse:  82  Resp: 16  Temp: (!) 97.4 F (36.3 C)  TempSrc: Temporal  SpO2: 99%  Weight: 250 lb (113.4 kg)  Height: 5\' 7"  (1.702 m)   BMI Assessment: Estimated body mass index is 39.16 kg/m as calculated from the following:   Height as of this encounter: 5\' 7"  (1.702 m).   Weight as of this encounter: 250 lb (113.4 kg).  BMI interpretation table: BMI level Category Range association with higher incidence of chronic pain  <18 kg/m2 Underweight    18.5-24.9 kg/m2 Ideal body weight   25-29.9 kg/m2 Overweight Increased incidence by 20%  30-34.9 kg/m2 Obese (Class I) Increased incidence by 68%  35-39.9 kg/m2 Severe obesity (Class II) Increased incidence by 136%  >40 kg/m2 Extreme obesity (Class III) Increased incidence by 254%   Patient's current BMI Ideal Body weight  Body mass index is 39.16 kg/m. Ideal body weight: 61.6 kg (135 lb 12.9 oz) Adjusted ideal body weight: 82.3 kg (181 lb 7.7 oz)   BMI Readings from Last 4 Encounters:  07/17/23 39.16 kg/m  06/30/23 40.35 kg/m  12/17/22 42.44 kg/m  06/04/22 42.26 kg/m   Wt Readings from Last 4 Encounters:  07/17/23 250 lb (113.4 kg)  06/30/23 257 lb 9.6 oz (116.8 kg)  12/17/22 271 lb (122.9 kg)  06/04/22 269 lb 12.8 oz (122.4 kg)    Psych/Mental status: Alert, oriented x 3 (person, place, & time)       Eyes: PERLA Respiratory: No evidence of acute respiratory distress  Cervical Spine Area Exam  Skin & Axial Inspection: No masses, redness, edema, swelling, or associated skin lesions Alignment: Symmetrical Functional ROM: Unrestricted ROM      Stability: No instability detected Muscle Tone/Strength: Functionally intact. No obvious neuro-muscular anomalies detected. Sensory (Neurological): Neurogenic pain pattern left occipital region, tender to touch Palpation: Complains of area being tender to palpation and increased sensitivity of left occipital region             Negative Spurling's  Upper Extremity (UE) Exam    Side: Right upper extremity  Side: Left upper extremity  Skin & Extremity Inspection: Skin color, temperature, and hair growth are WNL. No peripheral edema or cyanosis. No masses, redness, swelling, asymmetry, or associated skin lesions. No contractures.  Skin & Extremity Inspection: Skin color, temperature, and hair growth are WNL. No peripheral edema or cyanosis. No masses, redness, swelling, asymmetry, or associated skin lesions. No contractures.  Functional  ROM: Unrestricted ROM          Functional ROM: Unrestricted ROM          Muscle Tone/Strength: Functionally intact. No obvious neuro-muscular anomalies detected.  Muscle Tone/Strength: Functionally intact. No obvious neuro-muscular anomalies detected.  Sensory (Neurological): Unimpaired          Sensory (Neurological): Unimpaired          Palpation: No palpable anomalies              Palpation: No palpable anomalies              Provocative Test(s):  Phalen's test: deferred Tinel's test: deferred Apley's scratch test (touch opposite shoulder):  Action 1 (Across chest): deferred Action 2 (Overhead): deferred Action 3 (LB reach): deferred   Provocative Test(s):  Phalen's test: deferred Tinel's test: deferred Apley's scratch test (touch opposite shoulder):  Action 1 (Across chest): deferred Action 2 (Overhead): deferred Action 3 (LB reach): deferred    Thoracic Spine Area Exam  Skin & Axial Inspection: No masses, redness, or swelling  Alignment: Symmetrical Functional ROM: Unrestricted ROM Stability: No instability detected Muscle Tone/Strength: Functionally intact. No obvious neuro-muscular anomalies detected. Sensory (Neurological): Unimpaired Muscle strength & Tone: No palpable anomalies Lumbar Spine Area Exam  Skin & Axial Inspection: No masses, redness, or swelling Alignment: Symmetrical Functional ROM: Pain restricted ROM affecting primarily the left Stability: No instability detected Muscle Tone/Strength: Functionally intact. No obvious neuro-muscular anomalies detected. Sensory (Neurological): Dermatomal pain pattern Provocative Tests: Hyperextension/rotation test: deferred today       Lumbar quadrant test (Kemp's test): (+) on the left for foraminal stenosis  Gait & Posture Assessment  Ambulation: Unassisted Gait: Relatively normal for age and body habitus Posture: WNL  Lower Extremity Exam    Side: Right lower extremity  Side: Left lower extremity  Stability: No  instability observed          Stability: No instability observed          Skin & Extremity Inspection: Skin color, temperature, and hair growth are WNL. No peripheral edema or cyanosis. No masses, redness, swelling, asymmetry, or associated skin lesions. No contractures.  Skin & Extremity Inspection: Skin color, temperature, and hair growth are WNL. No peripheral edema or cyanosis. No masses, redness, swelling, asymmetry, or associated skin lesions. No contractures.  Functional ROM: Unrestricted ROM                  Functional ROM: Pain restricted ROM for hip and knee joints          Muscle Tone/Strength: Functionally intact. No obvious neuro-muscular anomalies detected.  Muscle Tone/Strength: Functionally intact. No obvious neuro-muscular anomalies detected.  Sensory (Neurological): Unimpaired        Sensory (Neurological): Dermatomal pain pattern        DTR: Patellar: deferred today Achilles: deferred today Plantar: deferred today  DTR: Patellar: deferred today Achilles: deferred today Plantar: deferred today  Palpation: No palpable anomalies  Palpation: No palpable anomalies    Assessment  Primary Diagnosis & Pertinent Problem List: The primary encounter diagnosis was Cervico-occipital neuralgia of left side. Diagnoses of Cervical disc herniation, Lumbar radiculopathy, Foraminal stenosis of lumbar region, and Chronic pain syndrome were also pertinent to this visit.  Visit Diagnosis (New problems to examiner): 1. Cervico-occipital neuralgia of left side   2. Cervical disc herniation   3. Lumbar radiculopathy   4. Foraminal stenosis of lumbar region   5. Chronic pain syndrome    Plan of Care (Initial workup plan)  I reviewed the patient's cervical and lumbar MRI with her in detail  I discussed a left greater occipital nerve block for her left occipital pain.  If this is helpful we can consider pulsed radiofrequency ablation of the left greater occipital nerve versus peripheral nerve  stimulation.  If the occipital nerve block is not helpful, we could consider a diagnostic cervical epidural steroid injection although her symptoms are not necessarily consistent with cervical radiculopathy.  I discussed a left transforaminal epidural steroid injection for her low back and radiating left leg pain.  Her MRI shows a left central and subarticular disc protrusion at L5-S1 affecting the left S1 nerve primarily.   We discussed a left L5 and S1 transforaminal ESI.   1. Cervico-occipital neuralgia of left side - gabapentin (NEURONTIN) 300 MG capsule; Take 1 capsule (300 mg total) by mouth at bedtime for 15 days, THEN 1 capsule (300 mg total) 2 (two) times daily.  Dispense: 105 capsule; Refill: 0 - GREATER OCCIPITAL NERVE BLOCK; Future  2. Cervical disc herniation  3.  Lumbar radiculopathy - gabapentin (NEURONTIN) 300 MG capsule; Take 1 capsule (300 mg total) by mouth at bedtime for 15 days, THEN 1 capsule (300 mg total) 2 (two) times daily.  Dispense: 105 capsule; Refill: 0 - Lumbar Transforaminal Epidural; Future  4. Foraminal stenosis of lumbar region - gabapentin (NEURONTIN) 300 MG capsule; Take 1 capsule (300 mg total) by mouth at bedtime for 15 days, THEN 1 capsule (300 mg total) 2 (two) times daily.  Dispense: 105 capsule; Refill: 0 - Lumbar Transforaminal Epidural; Future  5. Chronic pain syndrome - gabapentin (NEURONTIN) 300 MG capsule; Take 1 capsule (300 mg total) by mouth at bedtime for 15 days, THEN 1 capsule (300 mg total) 2 (two) times daily.  Dispense: 105 capsule; Refill: 0 - GREATER OCCIPITAL NERVE BLOCK; Future - Lumbar Transforaminal Epidural; Future    Procedure Orders         GREATER OCCIPITAL NERVE BLOCK         Lumbar Transforaminal Epidural      Pharmacotherapy (current): Medications ordered:  Meds ordered this encounter  Medications   gabapentin (NEURONTIN) 300 MG capsule    Sig: Take 1 capsule (300 mg total) by mouth at bedtime for 15 days, THEN  1 capsule (300 mg total) 2 (two) times daily.    Dispense:  105 capsule    Refill:  0    Fill one day early if pharmacy is closed on scheduled refill date. May substitute for generic if available.   Medications administered during this visit: [facility] had no medications administered during this visit.   Provider-requested follow-up: Return in about 18 days (around 08/04/2023) for Left GONB + Left L5 and S1 TF ESI, in clinic IV Versed.  Future Appointments  Date Time Provider Department Center  07/23/2023  9:00 AM [facility]  08/06/2023  9:40 AM Edward Jolly, MD [facility] None  09/29/2023  9:45 AM Lovenia Kim, MD [facility] None    Duration of encounter: .  Total time on encounter, as per AMA guidelines included both the face-to-face and non-face-to-face time personally spent by the physician and/or other qualified health care professional(s) on the day of the encounter (includes time in activities that require the physician or other qualified health care professional and does not include time in activities normally performed by clinical staff). Physician's time may include the following activities when performed: Preparing to see the patient (e.g., pre-charting review of records, searching for previously ordered imaging, lab work, and nerve conduction tests) Review of prior analgesic pharmacotherapies. Reviewing PMP Interpreting ordered tests (e.g., lab work, imaging, nerve conduction tests) Performing post-procedure evaluations, including interpretation of diagnostic procedures Obtaining and/or reviewing separately obtained history Performing a medically appropriate examination and/or evaluation Counseling and educating the patient/family/caregiver Ordering medications, tests, or procedures Referring and communicating with other health care professionals (when not separately reported) Documenting clinical information in the electronic or other health  record Independently interpreting results (not separately reported) and communicating results to the patient/ family/caregiver Care coordination (not separately reported)  Note by: Edward Jolly, MD (TTS technology used. I apologize for any typographical errors that were not detected and corrected.) Date: 07/17/2023; Time: 4:55 PM

## 2023-07-17 NOTE — Progress Notes (Signed)
Safety precautions to be maintained throughout the outpatient stay will include: orient to surroundings, keep bed in low position, maintain call bell within reach at all times, provide assistance with transfer out of bed and ambulation.  

## 2023-07-23 ENCOUNTER — Ambulatory Visit
Admission: RE | Admit: 2023-07-23 | Discharge: 2023-07-23 | Disposition: A | Discharge status: Home / Self Care | Payer: BC Managed Care – PPO | Source: Ambulatory Visit | Attending: Neurosurgery | Admitting: Neurosurgery

## 2023-07-23 DIAGNOSIS — R519 Headache, unspecified: Secondary | ICD-10-CM | POA: Insufficient documentation

## 2023-07-23 DIAGNOSIS — G8929 Other chronic pain: Secondary | ICD-10-CM | POA: Insufficient documentation

## 2023-08-06 ENCOUNTER — Ambulatory Visit: Payer: BC Managed Care – PPO | Admitting: Student in an Organized Health Care Education/Training Program

## 2023-09-29 ENCOUNTER — Ambulatory Visit: Payer: BC Managed Care – PPO | Admitting: Neurosurgery

## 2024-01-03 ENCOUNTER — Other Ambulatory Visit: Payer: Self-pay | Admitting: Student in an Organized Health Care Education/Training Program

## 2024-01-03 DIAGNOSIS — M5416 Radiculopathy, lumbar region: Secondary | ICD-10-CM

## 2024-01-03 DIAGNOSIS — M48061 Spinal stenosis, lumbar region without neurogenic claudication: Secondary | ICD-10-CM

## 2024-01-03 DIAGNOSIS — M5481 Occipital neuralgia: Secondary | ICD-10-CM

## 2024-01-03 DIAGNOSIS — G894 Chronic pain syndrome: Secondary | ICD-10-CM

## 2024-01-20 ENCOUNTER — Ambulatory Visit: Payer: Self-pay | Admitting: Obstetrics and Gynecology

## 2024-02-10 ENCOUNTER — Other Ambulatory Visit: Payer: Self-pay | Admitting: Neurology

## 2024-02-10 DIAGNOSIS — G932 Benign intracranial hypertension: Secondary | ICD-10-CM

## 2024-02-10 DIAGNOSIS — R519 Headache, unspecified: Secondary | ICD-10-CM

## 2024-02-15 ENCOUNTER — Ambulatory Visit
Admission: RE | Admit: 2024-02-15 | Discharge: 2024-02-15 | Disposition: A | Discharge status: Home / Self Care | Source: Ambulatory Visit | Attending: Neurology | Admitting: Neurology

## 2024-02-15 DIAGNOSIS — G932 Benign intracranial hypertension: Secondary | ICD-10-CM | POA: Diagnosis present

## 2024-02-15 DIAGNOSIS — R519 Headache, unspecified: Secondary | ICD-10-CM | POA: Insufficient documentation

## 2024-02-15 MED ORDER — GADOBUTROL 1 MMOL/ML IV SOLN
10.0000 mL | Freq: Once | INTRAVENOUS | Status: AC | PRN
  Administered 2024-02-15: 10 mL via INTRAVENOUS

## 2024-02-26 ENCOUNTER — Other Ambulatory Visit: Payer: Self-pay | Admitting: Obstetrics and Gynecology

## 2024-02-26 DIAGNOSIS — Z3041 Encounter for surveillance of contraceptive pills: Secondary | ICD-10-CM

## 2024-09-08 ENCOUNTER — Ambulatory Visit
Admission: EM | Admit: 2024-09-08 | Discharge: 2024-09-08 | Disposition: A | Discharge status: Home / Self Care | Attending: Emergency Medicine | Admitting: Emergency Medicine

## 2024-09-08 ENCOUNTER — Encounter: Payer: Self-pay | Admitting: Emergency Medicine

## 2024-09-08 ENCOUNTER — Ambulatory Visit (INDEPENDENT_AMBULATORY_CARE_PROVIDER_SITE_OTHER)

## 2024-09-08 DIAGNOSIS — R0789 Other chest pain: Secondary | ICD-10-CM

## 2024-09-08 DIAGNOSIS — R002 Palpitations: Secondary | ICD-10-CM | POA: Diagnosis present

## 2024-09-08 LAB — SARS CORONAVIRUS 2 BY RT PCR: SARS Coronavirus 2 by RT PCR: NEGATIVE

## 2024-09-08 NOTE — ED Triage Notes (Signed)
 Patient states that she's had heart palpitations for the past 2 days. Patient states that today she's having some chest tightness. Patient has a hx of hypertension.

## 2024-09-08 NOTE — Discharge Instructions (Addendum)
 Your chest x-ray did not show any evidence of infection and your COVID test was negative.  Your EKG was also very reassuring and that showed no evidence of acute cardiac issues which could explain your symptoms.  Your symptoms may very well be related to a stress reaction or given your elevated temp you could be developing a viral process.  I am going to make a referral to cardiology for an evaluation.  They will contact you to schedule an appointment.  If you develop any worsening chest tightness, chest pain, palpitations, dizziness, shortness of breath, or fainting I recommend you call 911 and go to the ER.

## 2024-09-08 NOTE — ED Provider Notes (Signed)
 [facility]    CSN: [id number] Arrival date & time: 09/08/24  1840      History   Chief Complaint Chief Complaint  Patient presents with   Cough    HPI Dana Shepherd is a 38 y.o. female.   HPI  38 year old female with past medical history significant for sleep apnea, obesity, hypothyroidism, hypertension, chronic pain syndrome, and dysrhythmia who presents for evaluation of heart palpitations and chest tightness that started 2 days ago.  She does take metoprolol and reports that she has not missed any doses.  The palpitations typically occur about twice a day and last for approximately 30 minutes.  Stress provokes her symptoms but she cannot think of anything that alleviates the symptoms.  She came in tonight because she has had central chest tightness for the past 2 hours.  No runny nose, nasal congestion, cough, fever, shortness breath, or wheezing.  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   Chronic pain syndrome 07/17/2023   Cervico-occipital neuralgia of left side 07/14/2023   Chronic nonintractable headache 07/14/2023   Cervicalgia 06/30/2023   Cyst of left ovary 12/17/2022   Dermoid cyst of right ovary 11/23/2019   Essential hypertension 11/23/2019    Past Surgical History:  Procedure Laterality Date   CARPAL TUNNEL RELEASE  10/2022   right hand 04/2022   CESAREAN SECTION  2012   LAPAROSCOPIC LYSIS OF ADHESIONS  01/03/2022   Procedure: LAPAROSCOPIC LYSIS OF ADHESIONS;  Surgeon: Arloa Lamar SQUIBB, MD;  Location: [facility] ORS;  Service: Gynecology;;    TONSILLECTOMY      OB History     Gravida  1   Para  1   Term  1   Preterm      AB      Living  1      SAB      IAB      Ectopic      Multiple      Live Births  1            Home Medications    Prior to Admission medications   Medication Sig Start Date End Date Taking? Authorizing Provider  hydrochlorothiazide (HYDRODIURIL) 25 MG tablet Take 1 tablet by mouth daily. 07/06/21  Yes [provider]  metoprolol succinate (TOPROL-XL) 50 MG 24 hr tablet Take 50 mg by mouth daily. 10/15/21  Yes [provider]  norethindrone  (MICRONOR ) 0.35 MG tablet Take 1 tablet (0.35 mg total) by mouth daily. 12/17/22  Yes Copland, Alicia B, PA-C  calcium carbonate (TUMS - DOSED IN MG ELEMENTAL CALCIUM) 500 MG chewable tablet Chew 2-3 tablets by mouth daily as needed for indigestion or heartburn.    [provider]  Cholecalciferol (VITAMIN D) 50 MCG (2000 UT) tablet Take 2,000 Units by mouth daily.    [provider]  fluticasone  (FLONASE ) 50 MCG/ACT nasal spray Place 1 spray into both nostrils 2 (two) times daily. Patient taking differently: Place 1 spray into both nostrils daily as needed for allergies. 08/07/21  Van Knee, MD  gabapentin  (NEURONTIN ) 300 MG capsule Take 1 capsule (300 mg total) by mouth at bedtime for 15 days, THEN 1 capsule (300 mg total) 2 (two) times daily. 07/17/23 09/15/23  Marcelino Nurse, MD  ibuprofen  (ADVIL ) 200 MG tablet Take 800 mg by mouth every 6 (six) hours as needed for moderate pain.    [provider]  prochlorperazine  (COMPAZINE ) 10 MG tablet Take 1 tablet (10 mg total) by mouth every 8 (eight) hours as needed for nausea or vomiting (headache). 03/27/20 08/04/20  Lang Dover, MD    Family History Family History  Problem Relation Age of Onset   Diabetes Mother        DM Type 2   Multiple myeloma Mother 82   Hypertension Father    Diabetes Sister        DM Type 2   Hypertension Sister      Social History Social History   Tobacco Use   Smoking status: Never   Smokeless tobacco: Never  Vaping Use   Vaping status: Never Used  Substance Use Topics   Alcohol use: Yes    Comment: social   Drug use: Never     Allergies   Hydrocodone   Review of Systems Review of Systems  Constitutional:  Negative for fever.  HENT:  Negative for congestion and rhinorrhea.   Respiratory:  Positive for chest tightness. Negative for cough, shortness of breath and wheezing.   Cardiovascular:  Positive for palpitations. Negative for chest pain.     Physical Exam Triage Vital Signs ED Triage Vitals  Encounter Vitals Group     BP      Girls Systolic BP Percentile      Girls Diastolic BP Percentile      Boys Systolic BP Percentile      Boys Diastolic BP Percentile      Pulse      Resp      Temp      Temp src      SpO2      Weight      Height      Head Circumference      Peak Flow      Pain Score      Pain Loc      Pain Education      Exclude from Growth Chart    No data found.  Updated Vital Signs BP (!) 155/92 (BP Location: Left Arm)   Pulse 88   Temp 99.1 F (37.3 C) (Oral)   Resp 17   Wt 240 lb (108.9 kg)   LMP 08/28/2024 (Exact Date)   SpO2 97%   BMI 37.59 kg/m   Visual Acuity Right Eye Distance:   Left Eye Distance:   Bilateral Distance:    Right Eye Near:   Left Eye Near:    Bilateral Near:     Physical Exam Vitals and nursing note reviewed.  Constitutional:      Appearance: Normal appearance. She is not ill-appearing.  HENT:     Head: Normocephalic and atraumatic.  Cardiovascular:     Rate and Rhythm: Normal rate and regular rhythm.     Pulses: Normal pulses.     Heart sounds: Normal heart sounds. No murmur heard.    No friction rub. No gallop.  Pulmonary:     Effort: Pulmonary effort is normal.     Breath sounds: Normal breath sounds. No wheezing, rhonchi or rales.  Skin:    General: Skin is warm  and dry.     Capillary Refill:  Capillary refill takes less than 2 seconds.     Findings: No rash.  Neurological:     General: No focal deficit present.     Mental Status: She is alert and oriented to person, place, and time.      UC Treatments / Results  Labs (all labs ordered are listed, but only abnormal results are displayed) Labs Reviewed  SARS CORONAVIRUS 2 BY RT PCR    EKG Normal sinus rhythm with a ventricular rate of 82 bpm PR interval 170 ms QRS duration 74 ms QT/QTc 396/460 ms No ST or T wave abnormalities noted.  Radiology DG Chest 2 View Result Date: 09/08/2024 EXAM: 2 VIEW(S) XRAY OF THE CHEST 09/08/2024 07:15:39 PM COMPARISON: 5 / 10 / 21 CLINICAL HISTORY: Chest tightness and palpitations. Patient states that she's had heart palpitations for the past 2 days. Patient states that today she's having some chest tightness. Patient has a hx of hypertension. FINDINGS: LUNGS AND PLEURA: No focal pulmonary opacity. No pulmonary edema. No pleural effusion. No pneumothorax. HEART AND MEDIASTINUM: No acute abnormality of the cardiac and mediastinal silhouettes. BONES AND SOFT TISSUES: No acute osseous abnormality. IMPRESSION: 1. No acute process. Electronically signed by: Norman Gatlin MD 09/08/2024 07:16 PM EDT RP Workstation: [id number]    Procedures Procedures (including critical care time)  Medications Ordered in UC Medications - No data to display  Initial Impression / Assessment and Plan / UC Course  I have reviewed the triage vital signs and the nursing notes.  Pertinent labs & imaging results that were available during my care of the patient were reviewed by me and considered in my medical decision making (see chart for details).   Patient is a nontoxic-appearing 37-year female presenting for evaluation of central chest tightness and palpitations as outlined in HPI above.  In the exam room she is not in any acute distress.  She is able to speak in full sentence without dyspnea or tachypnea.   Respiratory rate is 17 with 97% room air oxygen saturation.  Cardiopulmonary exam was S1-S2 heart sounds with regular rate and rhythm lung sounds are clear to auscultation in all fields.  Her EKG shows normal sinus rhythm without any ST or T wave abnormalities.  Of note, is that she has an elevated temp of 99.1.  She reports that last week she did have a URI but that is largely resolved.  Given that she does have elevated temp with this chest tightness and palpitations I will obtain a COVID test as well as a chest x-ray to evaluate for any acute cardiopulmonary pathology.  The patient also reports that stress tends to exacerbate her symptoms and this may be a stress reaction given that she reports she is under an increased amount of stress.  Chest x-ray independent reviewed and evaluated by me.  Impression: No evidence of infiltrate or effusion.  Cardiomediastinal silhouette appears normal.  Radiology overread is pending. Radiology impression states no acute process.  COVID PCR is negative.  Etiology of the patient's palpitations and chest tightness is unclear.  The mildly elevated temp suggest possible viral process.  I will refer the patient to cardiology for evaluation and give strict ER precautions.     Final Clinical Impressions(s) / UC Diagnoses   Final diagnoses:  Chest tightness  Palpitations     Discharge Instructions      Your chest x-ray did not show any evidence of infection and your COVID test  was negative.  Your EKG was also very reassuring and that showed no evidence of acute cardiac issues which could explain your symptoms.  Your symptoms may very well be related to a stress reaction or given your elevated temp you could be developing a viral process.  I am going to make a referral to cardiology for an evaluation.  They will contact you to schedule an appointment.  If you develop any worsening chest tightness, chest pain, palpitations, dizziness, shortness of breath, or  fainting I recommend you call 911 and go to the ER.     ED Prescriptions   None    PDMP not reviewed this encounter.   Bernardino Ditch, NP 09/08/24 1943
